# Patient Record
Sex: Female | Born: 1955 | Race: Black or African American | Hispanic: No | Marital: Married | Smoking: Never smoker
Health system: Southern US, Community
[De-identification: ages and names within clinical notes are randomized; demographics above are authoritative.]

## PROBLEM LIST (undated history)

## (undated) DIAGNOSIS — H16149 Punctate keratitis, unspecified eye: Secondary | ICD-10-CM

## (undated) DIAGNOSIS — S52501A Unspecified fracture of the lower end of right radius, initial encounter for closed fracture: Secondary | ICD-10-CM

## (undated) DIAGNOSIS — R079 Chest pain, unspecified: Secondary | ICD-10-CM

## (undated) DIAGNOSIS — E2839 Other primary ovarian failure: Secondary | ICD-10-CM

## (undated) HISTORY — DX: Unspecified fracture of the lower end of right radius, initial encounter for closed fracture: S52.501A

## (undated) HISTORY — DX: Punctate keratitis, unspecified eye: H16.149

## (undated) HISTORY — DX: Chest pain, unspecified: R07.9

## (undated) HISTORY — DX: Other primary ovarian failure: E28.39

---

## 1999-04-15 ENCOUNTER — Other Ambulatory Visit: Admission: RE | Admit: 1999-04-15 | Discharge: 1999-04-15 | Payer: Self-pay | Admitting: Family Medicine

## 1999-04-21 ENCOUNTER — Ambulatory Visit (HOSPITAL_COMMUNITY): Admission: RE | Admit: 1999-04-21 | Discharge: 1999-04-21 | Payer: Self-pay | Admitting: Family Medicine

## 1999-04-21 ENCOUNTER — Encounter: Payer: Self-pay | Admitting: Family Medicine

## 2000-03-22 ENCOUNTER — Encounter: Payer: Self-pay | Admitting: Family Medicine

## 2000-03-22 ENCOUNTER — Encounter: Admission: RE | Admit: 2000-03-22 | Discharge: 2000-03-22 | Payer: Self-pay | Admitting: Family Medicine

## 2000-03-25 ENCOUNTER — Encounter: Payer: Self-pay | Admitting: Family Medicine

## 2000-03-25 ENCOUNTER — Encounter: Admission: RE | Admit: 2000-03-25 | Discharge: 2000-03-25 | Payer: Self-pay | Admitting: Family Medicine

## 2000-04-09 ENCOUNTER — Encounter: Payer: Self-pay | Admitting: Family Medicine

## 2000-04-09 ENCOUNTER — Encounter: Admission: RE | Admit: 2000-04-09 | Discharge: 2000-04-09 | Payer: Self-pay | Admitting: Family Medicine

## 2000-04-22 ENCOUNTER — Encounter: Payer: Self-pay | Admitting: Family Medicine

## 2000-04-22 ENCOUNTER — Ambulatory Visit (HOSPITAL_COMMUNITY): Admission: RE | Admit: 2000-04-22 | Discharge: 2000-04-22 | Payer: Self-pay | Admitting: Family Medicine

## 2003-11-08 ENCOUNTER — Other Ambulatory Visit: Admission: RE | Admit: 2003-11-08 | Discharge: 2003-11-08 | Payer: Self-pay | Admitting: Family Medicine

## 2003-11-09 ENCOUNTER — Encounter: Admission: RE | Admit: 2003-11-09 | Discharge: 2003-11-09 | Payer: Self-pay | Admitting: Family Medicine

## 2003-11-16 ENCOUNTER — Ambulatory Visit (HOSPITAL_COMMUNITY): Admission: RE | Admit: 2003-11-16 | Discharge: 2003-11-16 | Payer: Self-pay | Admitting: Family Medicine

## 2005-01-08 ENCOUNTER — Ambulatory Visit (HOSPITAL_COMMUNITY): Admission: RE | Admit: 2005-01-08 | Discharge: 2005-01-08 | Payer: Self-pay | Admitting: Family Medicine

## 2006-01-11 ENCOUNTER — Ambulatory Visit (HOSPITAL_COMMUNITY): Admission: RE | Admit: 2006-01-11 | Discharge: 2006-01-11 | Payer: Self-pay | Admitting: Family Medicine

## 2006-05-21 ENCOUNTER — Ambulatory Visit: Payer: Self-pay | Admitting: Family Medicine

## 2006-06-03 ENCOUNTER — Ambulatory Visit: Payer: Self-pay | Admitting: Family Medicine

## 2006-06-03 LAB — CONVERTED CEMR LAB
BUN: 4 mg/dL — ABNORMAL LOW (ref 6–23)
Basophils Absolute: 0 10*3/uL (ref 0.0–0.1)
CO2: 28 meq/L (ref 19–32)
Chloride: 105 meq/L (ref 96–112)
Cholesterol: 182 mg/dL (ref 0–200)
Creatinine, Ser: 0.9 mg/dL (ref 0.4–1.2)
Eosinophils Absolute: 0 10*3/uL (ref 0.0–0.6)
HDL: 66.1 mg/dL (ref >39.0)
LDL Cholesterol: 98 mg/dL (ref 0–99)
Lymphocytes Relative: 29.3 % (ref 12.0–46.0)
MCV: 90.8 fL (ref 78.0–100.0)
Neutrophils Relative %: 63.2 % (ref 43.0–77.0)
Platelets: 243 10*3/uL (ref 150–400)
RBC: 4.15 M/uL (ref 3.87–5.11)
Sodium: 140 meq/L (ref 135–145)
TSH: 0.9 microintl units/mL (ref 0.35–5.50)
Total CHOL/HDL Ratio: 2.8

## 2006-06-10 ENCOUNTER — Ambulatory Visit: Payer: Self-pay | Admitting: Family Medicine

## 2006-06-18 ENCOUNTER — Ambulatory Visit: Payer: Self-pay | Admitting: Family Medicine

## 2006-06-21 ENCOUNTER — Encounter: Admission: RE | Admit: 2006-06-21 | Discharge: 2006-06-21 | Payer: Self-pay | Admitting: Family Medicine

## 2006-06-25 ENCOUNTER — Other Ambulatory Visit: Admission: RE | Admit: 2006-06-25 | Discharge: 2006-06-25 | Payer: Self-pay | Admitting: Family Medicine

## 2006-06-25 ENCOUNTER — Encounter: Payer: Self-pay | Admitting: Family Medicine

## 2006-06-25 ENCOUNTER — Ambulatory Visit: Payer: Self-pay | Admitting: Family Medicine

## 2006-10-25 ENCOUNTER — Ambulatory Visit: Payer: Self-pay | Admitting: Family Medicine

## 2007-01-03 ENCOUNTER — Telehealth: Payer: Self-pay | Admitting: Family Medicine

## 2007-01-14 ENCOUNTER — Ambulatory Visit (HOSPITAL_COMMUNITY): Admission: RE | Admit: 2007-01-14 | Discharge: 2007-01-14 | Payer: Self-pay | Admitting: Family Medicine

## 2007-01-18 ENCOUNTER — Telehealth: Payer: Self-pay | Admitting: Family Medicine

## 2007-01-19 ENCOUNTER — Encounter: Admission: RE | Admit: 2007-01-19 | Discharge: 2007-01-19 | Payer: Self-pay | Admitting: Family Medicine

## 2007-06-16 ENCOUNTER — Ambulatory Visit: Payer: Self-pay | Admitting: Family Medicine

## 2007-06-16 DIAGNOSIS — G43909 Migraine, unspecified, not intractable, without status migrainosus: Secondary | ICD-10-CM | POA: Insufficient documentation

## 2007-06-16 DIAGNOSIS — L659 Nonscarring hair loss, unspecified: Secondary | ICD-10-CM | POA: Insufficient documentation

## 2007-07-26 ENCOUNTER — Ambulatory Visit: Payer: Self-pay | Admitting: Family Medicine

## 2007-07-26 LAB — CONVERTED CEMR LAB
Alkaline Phosphatase: 60 units/L (ref 39–117)
Bilirubin Urine: NEGATIVE
Bilirubin, Direct: 0.1 mg/dL (ref 0.0–0.3)
Blood in Urine, dipstick: NEGATIVE
Calcium: 9.3 mg/dL (ref 8.4–10.5)
Chloride: 108 meq/L (ref 96–112)
Creatinine, Ser: 0.8 mg/dL (ref 0.4–1.2)
Glucose, Bld: 88 mg/dL (ref 70–99)
Glucose, Urine, Semiquant: NEGATIVE
HCT: 39.7 % (ref 36.0–46.0)
Ketones, urine, test strip: NEGATIVE
LDL Cholesterol: 100 mg/dL — ABNORMAL HIGH (ref 0–99)
MCV: 88.9 fL (ref 78.0–100.0)
Monocytes Absolute: 0.3 10*3/uL (ref 0.1–1.0)
Nitrite: NEGATIVE
Platelets: 196 10*3/uL (ref 150–400)
Potassium: 3.8 meq/L (ref 3.5–5.1)
RBC: 4.47 M/uL (ref 3.87–5.11)
Total Bilirubin: 0.8 mg/dL (ref 0.3–1.2)
Total CHOL/HDL Ratio: 2.4
Triglycerides: 52 mg/dL (ref 0–149)
Urobilinogen, UA: 0.2
VLDL: 10 mg/dL (ref 0–40)
WBC Urine, dipstick: NEGATIVE

## 2007-08-02 ENCOUNTER — Other Ambulatory Visit: Admission: RE | Admit: 2007-08-02 | Discharge: 2007-08-02 | Payer: Self-pay | Admitting: Family Medicine

## 2007-08-02 ENCOUNTER — Encounter: Payer: Self-pay | Admitting: Family Medicine

## 2007-08-02 ENCOUNTER — Ambulatory Visit: Payer: Self-pay | Admitting: Family Medicine

## 2007-08-02 ENCOUNTER — Encounter: Payer: Self-pay | Admitting: Internal Medicine

## 2007-08-02 DIAGNOSIS — N952 Postmenopausal atrophic vaginitis: Secondary | ICD-10-CM

## 2008-01-24 ENCOUNTER — Ambulatory Visit (HOSPITAL_COMMUNITY): Admission: RE | Admit: 2008-01-24 | Discharge: 2008-01-24 | Payer: Self-pay | Admitting: Family Medicine

## 2009-01-24 ENCOUNTER — Ambulatory Visit (HOSPITAL_COMMUNITY): Admission: RE | Admit: 2009-01-24 | Discharge: 2009-01-24 | Payer: Self-pay | Admitting: Family Medicine

## 2010-01-28 ENCOUNTER — Ambulatory Visit (HOSPITAL_COMMUNITY): Admission: RE | Admit: 2010-01-28 | Discharge: 2010-01-28 | Payer: Self-pay | Admitting: Family Medicine

## 2011-01-12 ENCOUNTER — Other Ambulatory Visit (HOSPITAL_COMMUNITY): Payer: Self-pay | Admitting: Family Medicine

## 2011-01-12 DIAGNOSIS — Z1231 Encounter for screening mammogram for malignant neoplasm of breast: Secondary | ICD-10-CM

## 2011-01-30 ENCOUNTER — Ambulatory Visit (HOSPITAL_COMMUNITY)
Admission: RE | Admit: 2011-01-30 | Discharge: 2011-01-30 | Disposition: A | Discharge status: Home / Self Care | Payer: BC Managed Care – PPO | Source: Ambulatory Visit | Attending: Family Medicine | Admitting: Family Medicine

## 2011-01-30 DIAGNOSIS — Z1231 Encounter for screening mammogram for malignant neoplasm of breast: Secondary | ICD-10-CM | POA: Insufficient documentation

## 2011-12-31 ENCOUNTER — Other Ambulatory Visit (HOSPITAL_COMMUNITY): Payer: Self-pay | Admitting: Family Medicine

## 2011-12-31 DIAGNOSIS — Z1231 Encounter for screening mammogram for malignant neoplasm of breast: Secondary | ICD-10-CM

## 2012-01-19 ENCOUNTER — Other Ambulatory Visit (HOSPITAL_COMMUNITY)
Admission: RE | Admit: 2012-01-19 | Discharge: 2012-01-19 | Disposition: A | Discharge status: Home / Self Care | Payer: BC Managed Care – PPO | Source: Ambulatory Visit | Attending: Family Medicine | Admitting: Family Medicine

## 2012-01-19 ENCOUNTER — Other Ambulatory Visit: Payer: Self-pay | Admitting: Family Medicine

## 2012-01-19 DIAGNOSIS — Z01419 Encounter for gynecological examination (general) (routine) without abnormal findings: Secondary | ICD-10-CM | POA: Insufficient documentation

## 2012-02-04 ENCOUNTER — Ambulatory Visit (HOSPITAL_COMMUNITY)
Admission: RE | Admit: 2012-02-04 | Discharge: 2012-02-04 | Disposition: A | Discharge status: Home / Self Care | Payer: BC Managed Care – PPO | Source: Ambulatory Visit | Attending: Family Medicine | Admitting: Family Medicine

## 2012-02-04 DIAGNOSIS — Z1231 Encounter for screening mammogram for malignant neoplasm of breast: Secondary | ICD-10-CM

## 2012-12-30 ENCOUNTER — Other Ambulatory Visit (HOSPITAL_COMMUNITY): Payer: Self-pay | Admitting: Family Medicine

## 2012-12-30 DIAGNOSIS — Z1231 Encounter for screening mammogram for malignant neoplasm of breast: Secondary | ICD-10-CM

## 2013-02-06 ENCOUNTER — Ambulatory Visit (HOSPITAL_COMMUNITY)
Admission: RE | Admit: 2013-02-06 | Discharge: 2013-02-06 | Disposition: A | Discharge status: Home / Self Care | Payer: BC Managed Care – PPO | Source: Ambulatory Visit | Attending: Family Medicine | Admitting: Family Medicine

## 2013-02-06 DIAGNOSIS — Z1231 Encounter for screening mammogram for malignant neoplasm of breast: Secondary | ICD-10-CM | POA: Insufficient documentation

## 2014-01-12 ENCOUNTER — Other Ambulatory Visit (HOSPITAL_COMMUNITY): Payer: Self-pay | Admitting: Family Medicine

## 2014-01-12 DIAGNOSIS — Z1231 Encounter for screening mammogram for malignant neoplasm of breast: Secondary | ICD-10-CM

## 2014-02-08 ENCOUNTER — Ambulatory Visit (HOSPITAL_COMMUNITY)
Admission: RE | Admit: 2014-02-08 | Discharge: 2014-02-08 | Disposition: A | Discharge status: Home / Self Care | Payer: BC Managed Care – PPO | Source: Ambulatory Visit | Attending: Family Medicine | Admitting: Family Medicine

## 2014-02-08 DIAGNOSIS — Z1231 Encounter for screening mammogram for malignant neoplasm of breast: Secondary | ICD-10-CM | POA: Diagnosis not present

## 2014-12-11 ENCOUNTER — Encounter (INDEPENDENT_AMBULATORY_CARE_PROVIDER_SITE_OTHER): Payer: BC Managed Care – PPO | Admitting: Ophthalmology

## 2014-12-11 DIAGNOSIS — H43813 Vitreous degeneration, bilateral: Secondary | ICD-10-CM

## 2015-02-14 ENCOUNTER — Other Ambulatory Visit: Payer: Self-pay

## 2015-02-14 DIAGNOSIS — Z1231 Encounter for screening mammogram for malignant neoplasm of breast: Secondary | ICD-10-CM

## 2015-03-01 ENCOUNTER — Ambulatory Visit
Admission: RE | Admit: 2015-03-01 | Discharge: 2015-03-01 | Disposition: A | Discharge status: Home / Self Care | Payer: BC Managed Care – PPO | Source: Ambulatory Visit

## 2015-03-01 DIAGNOSIS — Z1231 Encounter for screening mammogram for malignant neoplasm of breast: Secondary | ICD-10-CM

## 2015-05-24 ENCOUNTER — Other Ambulatory Visit (HOSPITAL_COMMUNITY)
Admission: RE | Admit: 2015-05-24 | Discharge: 2015-05-24 | Disposition: A | Discharge status: Home / Self Care | Payer: BC Managed Care – PPO | Source: Ambulatory Visit | Attending: Family Medicine | Admitting: Family Medicine

## 2015-05-24 ENCOUNTER — Other Ambulatory Visit: Payer: Self-pay | Admitting: Family Medicine

## 2015-05-24 DIAGNOSIS — Z1151 Encounter for screening for human papillomavirus (HPV): Secondary | ICD-10-CM | POA: Insufficient documentation

## 2015-05-24 DIAGNOSIS — Z01419 Encounter for gynecological examination (general) (routine) without abnormal findings: Secondary | ICD-10-CM | POA: Insufficient documentation

## 2015-05-28 LAB — CYTOLOGY - PAP

## 2016-01-27 ENCOUNTER — Other Ambulatory Visit: Payer: Self-pay | Admitting: Family Medicine

## 2016-01-27 DIAGNOSIS — Z1231 Encounter for screening mammogram for malignant neoplasm of breast: Secondary | ICD-10-CM

## 2016-03-02 ENCOUNTER — Ambulatory Visit
Admission: RE | Admit: 2016-03-02 | Discharge: 2016-03-02 | Disposition: A | Discharge status: Home / Self Care | Payer: BC Managed Care – PPO | Source: Ambulatory Visit | Attending: Family Medicine | Admitting: Family Medicine

## 2016-03-02 DIAGNOSIS — Z1231 Encounter for screening mammogram for malignant neoplasm of breast: Secondary | ICD-10-CM

## 2017-01-22 ENCOUNTER — Other Ambulatory Visit: Payer: Self-pay | Admitting: Family Medicine

## 2017-01-22 DIAGNOSIS — Z1231 Encounter for screening mammogram for malignant neoplasm of breast: Secondary | ICD-10-CM

## 2017-03-03 ENCOUNTER — Ambulatory Visit
Admission: RE | Admit: 2017-03-03 | Discharge: 2017-03-03 | Disposition: A | Discharge status: Home / Self Care | Payer: BC Managed Care – PPO | Source: Ambulatory Visit | Attending: Family Medicine | Admitting: Family Medicine

## 2017-03-03 DIAGNOSIS — Z1231 Encounter for screening mammogram for malignant neoplasm of breast: Secondary | ICD-10-CM

## 2018-04-08 ENCOUNTER — Other Ambulatory Visit: Payer: Self-pay | Admitting: Family Medicine

## 2018-04-08 DIAGNOSIS — Z1231 Encounter for screening mammogram for malignant neoplasm of breast: Secondary | ICD-10-CM

## 2018-05-10 ENCOUNTER — Other Ambulatory Visit: Payer: Self-pay | Admitting: Family Medicine

## 2018-05-10 ENCOUNTER — Ambulatory Visit
Admission: RE | Admit: 2018-05-10 | Discharge: 2018-05-10 | Disposition: A | Discharge status: Home / Self Care | Payer: BC Managed Care – PPO | Source: Ambulatory Visit | Attending: Family Medicine | Admitting: Family Medicine

## 2018-05-10 DIAGNOSIS — Z1231 Encounter for screening mammogram for malignant neoplasm of breast: Secondary | ICD-10-CM

## 2018-09-13 ENCOUNTER — Other Ambulatory Visit: Payer: Self-pay

## 2018-09-13 ENCOUNTER — Ambulatory Visit
Admission: EM | Admit: 2018-09-13 | Discharge: 2018-09-13 | Disposition: A | Discharge status: Home / Self Care | Payer: BC Managed Care – PPO | Attending: Physician Assistant | Admitting: Physician Assistant

## 2018-09-13 DIAGNOSIS — M654 Radial styloid tenosynovitis [de Quervain]: Secondary | ICD-10-CM | POA: Diagnosis not present

## 2018-09-13 MED ORDER — MELOXICAM 7.5 MG PO TABS: 7.5000 mg | ORAL_TABLET | Freq: Every day | ORAL | 0 refills | Status: AC

## 2018-09-13 NOTE — ED Provider Notes (Signed)
[facility]    CSN: [id number] Arrival date & time: 09/13/18  1517     History   Chief Complaint Chief Complaint  Patient presents with  . Wrist Pain    HPI Dominique Vazquez is a 63 y.o. female.   63 year old female comes in for 3-week history of left wrist pain.  Denies injury/trauma.  States pain mostly to the radial aspect of the wrist, worse with movement.  She denies increase in activity.  Does do weightlifting frequently.  She has a wrist splint, and has been wearing it with good relief.  States pain is worse with flexion and supination.  Denies numbness/tingling.  Denies radiation of pain.  Has not taken anything for symptoms.     History reviewed. No pertinent past medical history.  Patient Active Problem List   Diagnosis Date Noted  . POSTMENOPAUSAL ATROPHIC VAGINITIS 08/02/2007  . MIGRAINE HEADACHE 06/16/2007  . HAIR LOSS 06/16/2007    History reviewed. No pertinent surgical history.  OB History   No obstetric history on file.      Home Medications    Prior to Admission medications   Medication Sig Start Date End Date Taking? Authorizing Provider  meloxicam (MOBIC) 7.5 MG tablet Take 1 tablet (7.5 mg total) by mouth daily. 09/13/18   Ok Edwards, PA-C    Family History No family history on file.  Social History Social History   Tobacco Use  . Smoking status: Never Smoker  . Smokeless tobacco: Never Used  Substance Use Topics  . Alcohol use: Not Currently  . Drug use: Not on file     Allergies   Penicillins   Review of Systems Review of Systems  Reason unable to perform ROS: See HPI as above.     Physical Exam Triage Vital Signs ED Triage Vitals [09/13/18 1526]  Enc Vitals Group     BP (!) 165/85     Pulse Rate 65     Resp 20     Temp 98.7 F (37.1 C)     Temp Source Oral     SpO2 98 %     Weight      Height      Head Circumference      Peak Flow      Pain Score 5     Pain Loc      Pain Edu?      Excl. in [place]?     No data found.  Updated Vital Signs BP (!) 165/85 (BP Location: Left Arm)   Pulse 65   Temp 98.7 F (37.1 C) (Oral)   Resp 20   SpO2 98%   Physical Exam Constitutional:      General: She is not in acute distress.    Appearance: She is well-developed. She is not diaphoretic.  HENT:     Head: Normocephalic and atraumatic.  Eyes:     Conjunctiva/sclera: Conjunctivae normal.     Pupils: Pupils are equal, round, and reactive to light.  Musculoskeletal:     Comments: No swelling, erythema, warmth, contusion seen.  Tenderness to palpation of radial wrist.  Full range of motion of wrist, fingers.  Strength normal and equal bilaterally.  Sensation intact and equal bilaterally.  Radial pulse 2+, cap refill less than 2 seconds.  Negative Phalen's, Tinel's, Finkelstein.  Neurological:     Mental Status: She is alert and oriented to person, place, and time.     UC Treatments / Results  Labs (all labs  ordered are listed, but only abnormal results are displayed) Labs Reviewed - No data to display  EKG None  Radiology No results found.  Procedures Procedures (including critical care time)  Medications Ordered in UC Medications - No data to display  Initial Impression / Assessment and Plan / UC Course  I have reviewed the triage vital signs and the nursing notes.  Pertinent labs & imaging results that were available during my care of the patient were reviewed by me and considered in my medical decision making (see chart for details).    Although negative for Finkelstein's, patient history and exam was concerning for de Quervain's given radial tenderness to wrist with movement.  Will start NSAIDs, ice compress, continue wrist splint during activity.  Discussed resting and refrain from weight training at this time.  Return precautions given.  Patient expresses understanding and agrees to plan.  Final Clinical Impressions(s) / UC Diagnoses   Final diagnoses:  Dominique Vazquez  disease (radial styloid tenosynovitis)   ED Prescriptions    Medication Sig Dispense Auth. Provider   meloxicam (MOBIC) 7.5 MG tablet Take 1 tablet (7.5 mg total) by mouth daily. 15 tablet Tobin Chad, [place] 09/13/18 1702

## 2018-09-13 NOTE — ED Triage Notes (Signed)
Pt c/o lt wrist pain for the past 3wks, denies injury

## 2018-09-13 NOTE — Discharge Instructions (Signed)
Start Mobic. Do not take ibuprofen (motrin/advil)/ naproxen (aleve) while on mobic. You can add on voltaren gel as needed. Ice compress. Rest strenuous activity until symptoms improve.

## 2019-03-31 ENCOUNTER — Other Ambulatory Visit: Payer: Self-pay | Admitting: Family Medicine

## 2019-03-31 DIAGNOSIS — Z1231 Encounter for screening mammogram for malignant neoplasm of breast: Secondary | ICD-10-CM

## 2019-05-17 ENCOUNTER — Ambulatory Visit
Admission: RE | Admit: 2019-05-17 | Discharge: 2019-05-17 | Disposition: A | Discharge status: Home / Self Care | Payer: BC Managed Care – PPO | Source: Ambulatory Visit | Attending: Family Medicine | Admitting: Family Medicine

## 2019-05-17 ENCOUNTER — Other Ambulatory Visit: Payer: Self-pay

## 2019-05-17 DIAGNOSIS — Z1231 Encounter for screening mammogram for malignant neoplasm of breast: Secondary | ICD-10-CM

## 2019-06-15 ENCOUNTER — Ambulatory Visit: Payer: BC Managed Care – PPO | Attending: Internal Medicine

## 2019-06-15 DIAGNOSIS — Z20822 Contact with and (suspected) exposure to covid-19: Secondary | ICD-10-CM

## 2019-06-16 ENCOUNTER — Ambulatory Visit: Payer: Self-pay | Admitting: *Deleted

## 2019-06-16 LAB — NOVEL CORONAVIRUS, NAA: SARS-CoV-2, NAA: DETECTED — AB

## 2019-06-16 NOTE — Telephone Encounter (Signed)
Per initial encounter, " Patient is calling to ask a couple of questions about her positive COVID test. 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  Reason for Disposition . General information question, no triage required and triager able to answer question  Answer Assessment - Initial Assessment Questions 1. 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

## 2019-06-28 ENCOUNTER — Ambulatory Visit: Payer: BC Managed Care – PPO | Attending: Internal Medicine

## 2019-06-28 DIAGNOSIS — Z20822 Contact with and (suspected) exposure to covid-19: Secondary | ICD-10-CM

## 2019-06-29 LAB — NOVEL CORONAVIRUS, NAA: SARS-CoV-2, NAA: NOT DETECTED

## 2020-04-02 ENCOUNTER — Other Ambulatory Visit: Payer: Self-pay | Admitting: Family Medicine

## 2020-04-02 DIAGNOSIS — Z1231 Encounter for screening mammogram for malignant neoplasm of breast: Secondary | ICD-10-CM

## 2020-05-17 ENCOUNTER — Ambulatory Visit
Admission: RE | Admit: 2020-05-17 | Discharge: 2020-05-17 | Disposition: A | Discharge status: Home / Self Care | Payer: BC Managed Care – PPO | Source: Ambulatory Visit | Attending: Family Medicine | Admitting: Family Medicine

## 2020-05-17 ENCOUNTER — Other Ambulatory Visit: Payer: Self-pay

## 2020-05-17 DIAGNOSIS — Z1231 Encounter for screening mammogram for malignant neoplasm of breast: Secondary | ICD-10-CM | POA: Diagnosis not present

## 2020-10-25 DIAGNOSIS — Z03818 Encounter for observation for suspected exposure to other biological agents ruled out: Secondary | ICD-10-CM | POA: Diagnosis not present

## 2020-10-25 DIAGNOSIS — Z20822 Contact with and (suspected) exposure to covid-19: Secondary | ICD-10-CM | POA: Diagnosis not present

## 2021-01-15 DIAGNOSIS — Z0000 Encounter for general adult medical examination without abnormal findings: Secondary | ICD-10-CM | POA: Diagnosis not present

## 2021-01-15 DIAGNOSIS — Z131 Encounter for screening for diabetes mellitus: Secondary | ICD-10-CM | POA: Diagnosis not present

## 2021-01-30 DIAGNOSIS — S60562A Insect bite (nonvenomous) of left hand, initial encounter: Secondary | ICD-10-CM | POA: Diagnosis not present

## 2021-01-30 DIAGNOSIS — R21 Rash and other nonspecific skin eruption: Secondary | ICD-10-CM | POA: Diagnosis not present

## 2021-01-30 DIAGNOSIS — L089 Local infection of the skin and subcutaneous tissue, unspecified: Secondary | ICD-10-CM | POA: Diagnosis not present

## 2021-02-21 DIAGNOSIS — H2511 Age-related nuclear cataract, right eye: Secondary | ICD-10-CM | POA: Diagnosis not present

## 2021-02-21 DIAGNOSIS — H2513 Age-related nuclear cataract, bilateral: Secondary | ICD-10-CM | POA: Diagnosis not present

## 2021-03-13 IMAGING — MG MM DIGITAL SCREENING BILAT W/ TOMO AND CAD
8 series · 8 of 24 positions shown · non-contrast
Comparison: Previous exam(s).

CLINICAL DATA: Screening.

EXAM:
DIGITAL SCREENING BILATERAL MAMMOGRAM WITH TOMOSYNTHESIS AND CAD
TECHNIQUE: Bilateral screening digital craniocaudal and mediolateral oblique
mammograms were obtained. Bilateral screening digital breast
tomosynthesis was performed. The images were evaluated with
computer-aided detection.

[L CC synth-2D]
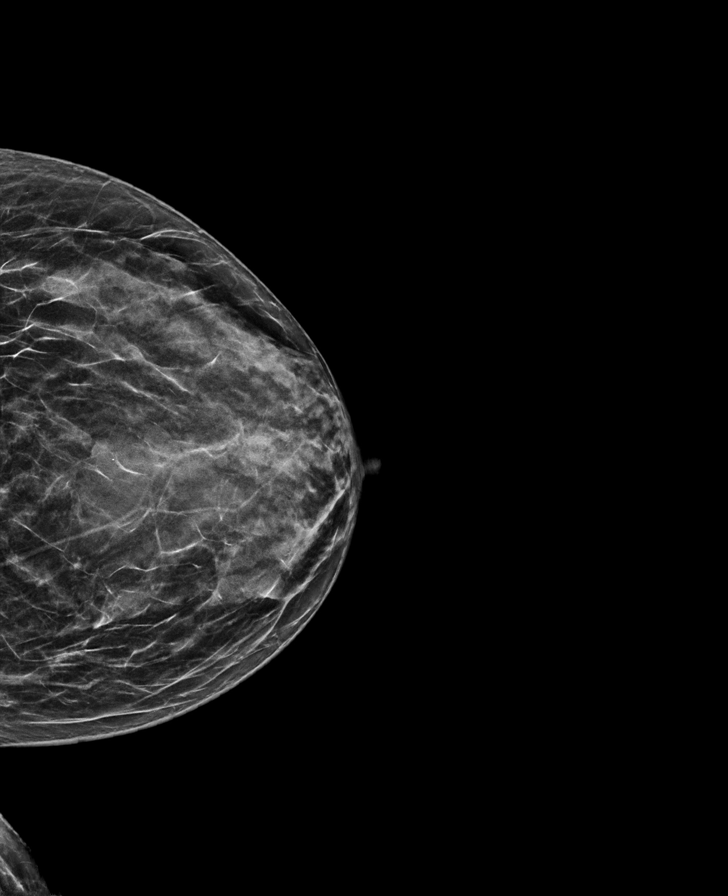

[R MLO synth-2D]
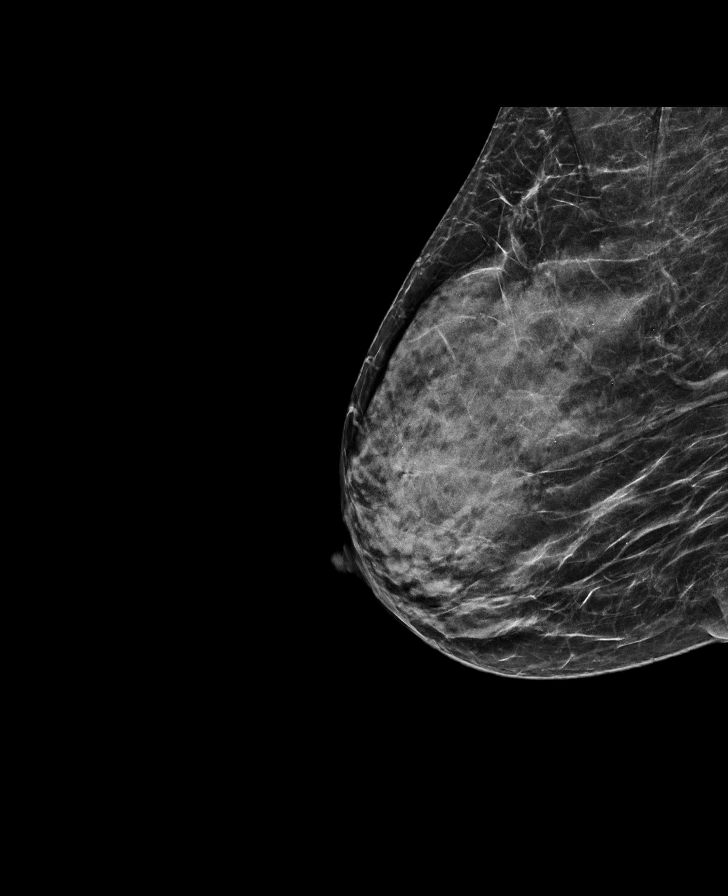

[L MLO synth-2D]
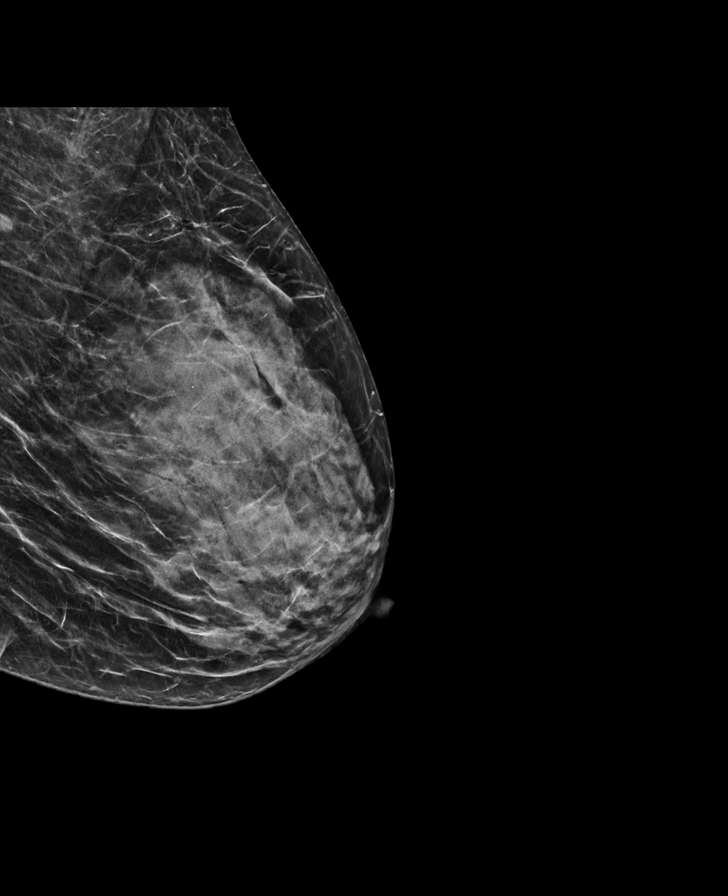

[R CC synth-2D]
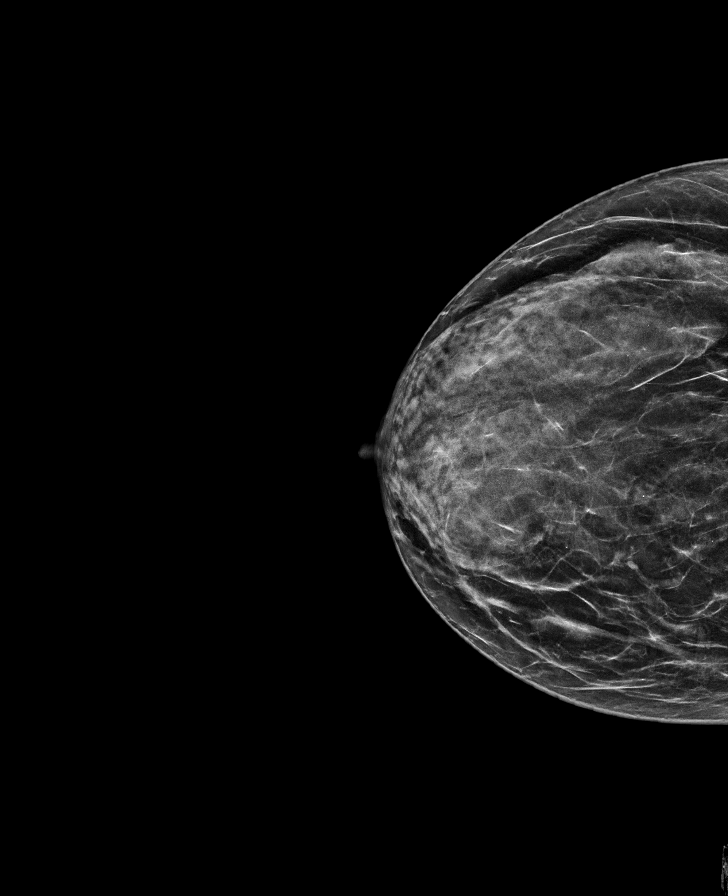

[L CC tomo · tomo slice 27/53.0]
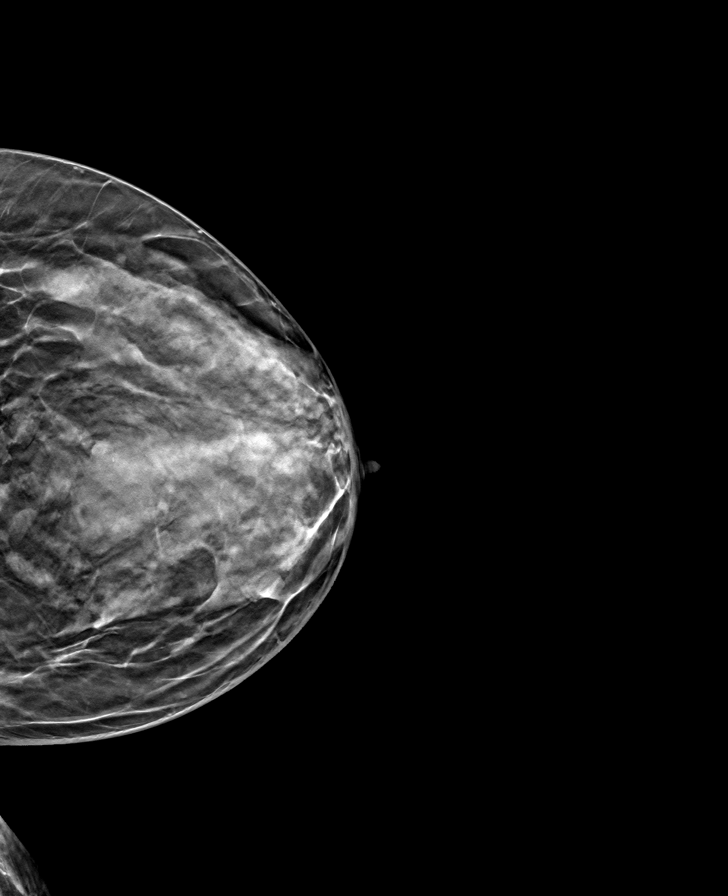

[R MLO tomo · tomo slice 27/54.0]
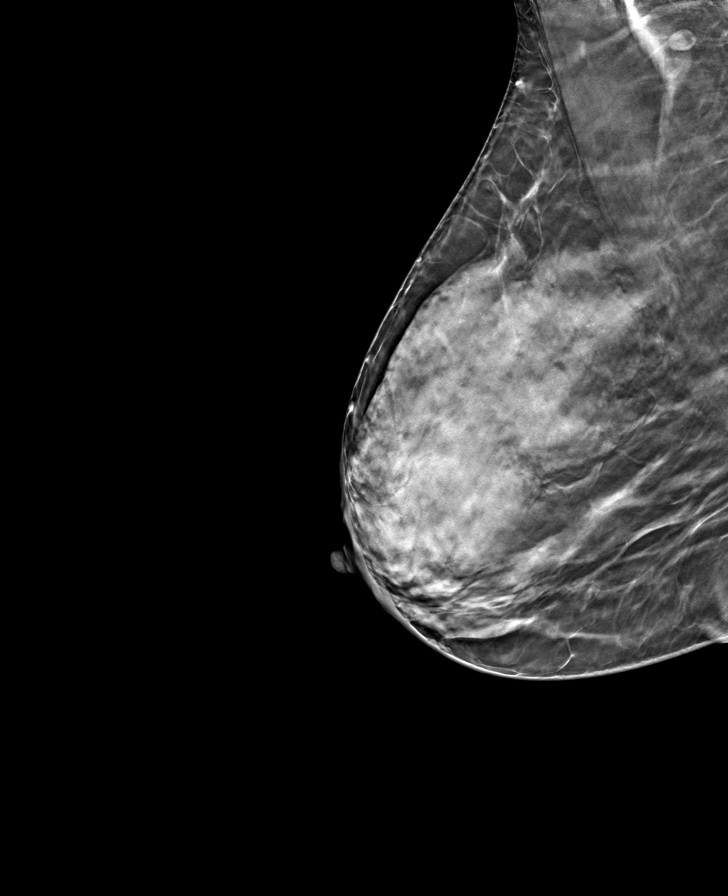

[L MLO tomo · tomo slice 27/53.0]
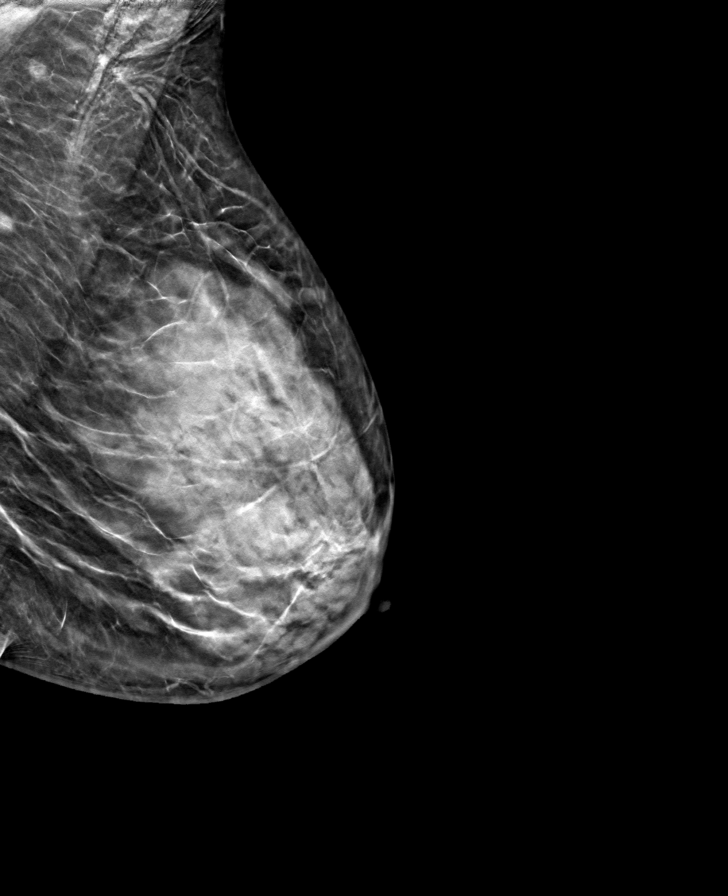

[R CC tomo · tomo slice 27/53.0]
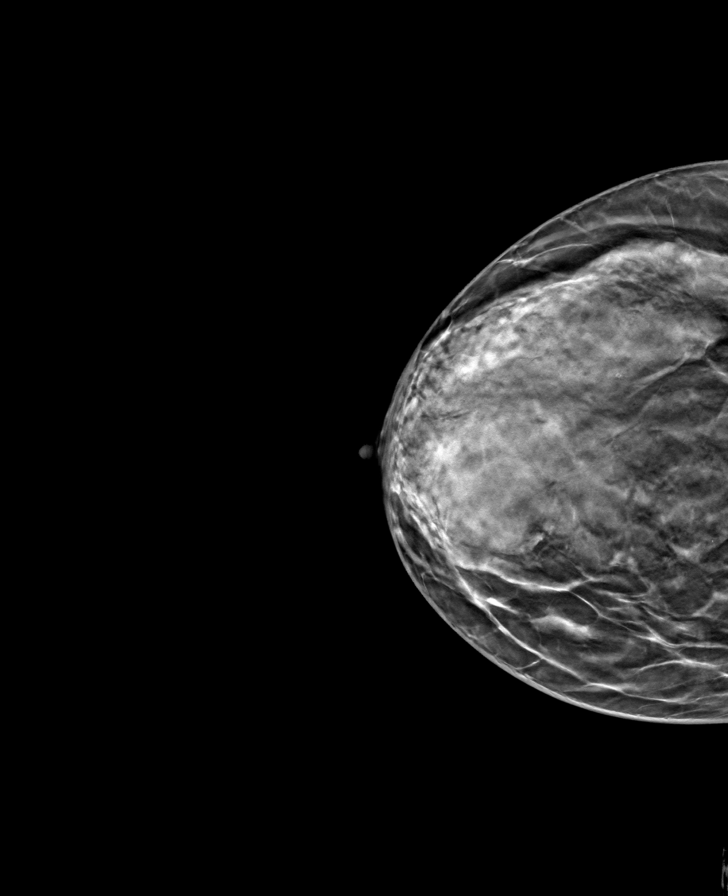

[8 of 24 positions shown; findings below may reference images not displayed]

ACR Breast Density Category d: The breast tissue is extremely dense,
which lowers the sensitivity of mammography
FINDINGS: There are no findings suspicious for malignancy.
IMPRESSION: No mammographic evidence of malignancy. A result letter of this
screening mammogram will be mailed directly to the patient.

RECOMMENDATION:
Screening mammogram in one year. (Code:[id number])

BI-RADS CATEGORY  1: Negative.

## 2021-04-01 ENCOUNTER — Ambulatory Visit
Admission: RE | Admit: 2021-04-01 | Discharge: 2021-04-01 | Disposition: A | Discharge status: Home / Self Care | Payer: Medicare Other | Source: Ambulatory Visit | Attending: Family Medicine | Admitting: Family Medicine

## 2021-04-01 ENCOUNTER — Other Ambulatory Visit: Payer: Self-pay

## 2021-04-01 ENCOUNTER — Other Ambulatory Visit: Payer: Self-pay | Admitting: Family Medicine

## 2021-04-01 DIAGNOSIS — S63501A Unspecified sprain of right wrist, initial encounter: Secondary | ICD-10-CM

## 2021-04-01 DIAGNOSIS — W19XXXA Unspecified fall, initial encounter: Secondary | ICD-10-CM | POA: Diagnosis not present

## 2021-04-01 DIAGNOSIS — S62101A Fracture of unspecified carpal bone, right wrist, initial encounter for closed fracture: Secondary | ICD-10-CM | POA: Diagnosis not present

## 2021-04-01 DIAGNOSIS — M25531 Pain in right wrist: Secondary | ICD-10-CM | POA: Diagnosis not present

## 2021-04-03 DIAGNOSIS — H2513 Age-related nuclear cataract, bilateral: Secondary | ICD-10-CM | POA: Diagnosis not present

## 2021-04-03 DIAGNOSIS — H2511 Age-related nuclear cataract, right eye: Secondary | ICD-10-CM | POA: Diagnosis not present

## 2021-04-03 DIAGNOSIS — Z961 Presence of intraocular lens: Secondary | ICD-10-CM | POA: Diagnosis not present

## 2021-04-04 DIAGNOSIS — H2512 Age-related nuclear cataract, left eye: Secondary | ICD-10-CM | POA: Diagnosis not present

## 2021-04-10 DIAGNOSIS — S52531D Colles' fracture of right radius, subsequent encounter for closed fracture with routine healing: Secondary | ICD-10-CM | POA: Diagnosis not present

## 2021-04-25 DIAGNOSIS — S52531D Colles' fracture of right radius, subsequent encounter for closed fracture with routine healing: Secondary | ICD-10-CM | POA: Diagnosis not present

## 2021-05-08 DIAGNOSIS — K635 Polyp of colon: Secondary | ICD-10-CM | POA: Diagnosis not present

## 2021-05-08 DIAGNOSIS — Z1211 Encounter for screening for malignant neoplasm of colon: Secondary | ICD-10-CM | POA: Diagnosis not present

## 2021-05-08 DIAGNOSIS — Z09 Encounter for follow-up examination after completed treatment for conditions other than malignant neoplasm: Secondary | ICD-10-CM | POA: Diagnosis not present

## 2021-05-08 DIAGNOSIS — Z86010 Personal history of colonic polyps: Secondary | ICD-10-CM | POA: Diagnosis not present

## 2021-05-08 DIAGNOSIS — D122 Benign neoplasm of ascending colon: Secondary | ICD-10-CM | POA: Diagnosis not present

## 2021-05-16 DIAGNOSIS — S52531D Colles' fracture of right radius, subsequent encounter for closed fracture with routine healing: Secondary | ICD-10-CM | POA: Diagnosis not present

## 2021-05-17 DIAGNOSIS — H16143 Punctate keratitis, bilateral: Secondary | ICD-10-CM | POA: Diagnosis not present

## 2021-05-19 ENCOUNTER — Other Ambulatory Visit: Payer: Self-pay | Admitting: Family Medicine

## 2021-05-19 DIAGNOSIS — E2839 Other primary ovarian failure: Secondary | ICD-10-CM

## 2021-05-19 DIAGNOSIS — H10023 Other mucopurulent conjunctivitis, bilateral: Secondary | ICD-10-CM | POA: Diagnosis not present

## 2021-06-09 DIAGNOSIS — H04123 Dry eye syndrome of bilateral lacrimal glands: Secondary | ICD-10-CM | POA: Diagnosis not present

## 2021-07-17 ENCOUNTER — Ambulatory Visit
Admission: RE | Admit: 2021-07-17 | Discharge: 2021-07-17 | Disposition: A | Discharge status: Home / Self Care | Payer: BC Managed Care – PPO | Source: Ambulatory Visit | Attending: Family Medicine | Admitting: Family Medicine

## 2021-07-17 ENCOUNTER — Other Ambulatory Visit: Payer: Self-pay | Admitting: Family Medicine

## 2021-07-17 DIAGNOSIS — E2839 Other primary ovarian failure: Secondary | ICD-10-CM

## 2021-07-17 DIAGNOSIS — M810 Age-related osteoporosis without current pathological fracture: Secondary | ICD-10-CM | POA: Diagnosis not present

## 2021-07-17 DIAGNOSIS — Z780 Asymptomatic menopausal state: Secondary | ICD-10-CM | POA: Diagnosis not present

## 2021-07-17 DIAGNOSIS — Z1231 Encounter for screening mammogram for malignant neoplasm of breast: Secondary | ICD-10-CM

## 2021-07-17 DIAGNOSIS — M8589 Other specified disorders of bone density and structure, multiple sites: Secondary | ICD-10-CM | POA: Diagnosis not present

## 2021-07-22 ENCOUNTER — Other Ambulatory Visit: Payer: BC Managed Care – PPO

## 2021-08-05 ENCOUNTER — Other Ambulatory Visit: Payer: BC Managed Care – PPO

## 2021-09-09 DIAGNOSIS — H00021 Hordeolum internum right upper eyelid: Secondary | ICD-10-CM | POA: Diagnosis not present

## 2022-01-29 DIAGNOSIS — M79641 Pain in right hand: Secondary | ICD-10-CM | POA: Diagnosis not present

## 2022-01-29 DIAGNOSIS — M810 Age-related osteoporosis without current pathological fracture: Secondary | ICD-10-CM | POA: Diagnosis not present

## 2022-01-29 DIAGNOSIS — Z131 Encounter for screening for diabetes mellitus: Secondary | ICD-10-CM | POA: Diagnosis not present

## 2022-01-29 DIAGNOSIS — Z791 Long term (current) use of non-steroidal anti-inflammatories (NSAID): Secondary | ICD-10-CM | POA: Diagnosis not present

## 2022-01-29 DIAGNOSIS — H04121 Dry eye syndrome of right lacrimal gland: Secondary | ICD-10-CM | POA: Diagnosis not present

## 2022-01-29 DIAGNOSIS — Z0000 Encounter for general adult medical examination without abnormal findings: Secondary | ICD-10-CM | POA: Diagnosis not present

## 2022-01-29 DIAGNOSIS — Z86010 Personal history of colonic polyps: Secondary | ICD-10-CM | POA: Diagnosis not present

## 2022-05-15 DIAGNOSIS — H00021 Hordeolum internum right upper eyelid: Secondary | ICD-10-CM | POA: Diagnosis not present

## 2022-06-03 ENCOUNTER — Other Ambulatory Visit: Payer: Self-pay | Admitting: Family Medicine

## 2022-06-03 DIAGNOSIS — Z1231 Encounter for screening mammogram for malignant neoplasm of breast: Secondary | ICD-10-CM

## 2022-07-23 ENCOUNTER — Ambulatory Visit
Admission: RE | Admit: 2022-07-23 | Discharge: 2022-07-23 | Disposition: A | Discharge status: Home / Self Care | Payer: BC Managed Care – PPO | Source: Ambulatory Visit | Attending: Family Medicine | Admitting: Family Medicine

## 2022-07-23 DIAGNOSIS — Z1231 Encounter for screening mammogram for malignant neoplasm of breast: Secondary | ICD-10-CM

## 2022-09-07 DIAGNOSIS — R202 Paresthesia of skin: Secondary | ICD-10-CM | POA: Diagnosis not present

## 2022-09-07 DIAGNOSIS — R42 Dizziness and giddiness: Secondary | ICD-10-CM | POA: Diagnosis not present

## 2022-09-07 DIAGNOSIS — R55 Syncope and collapse: Secondary | ICD-10-CM | POA: Diagnosis not present

## 2022-09-07 DIAGNOSIS — I10 Essential (primary) hypertension: Secondary | ICD-10-CM | POA: Diagnosis not present

## 2022-09-09 DIAGNOSIS — R079 Chest pain, unspecified: Secondary | ICD-10-CM | POA: Diagnosis not present

## 2022-09-09 DIAGNOSIS — R030 Elevated blood-pressure reading, without diagnosis of hypertension: Secondary | ICD-10-CM | POA: Diagnosis not present

## 2022-09-16 ENCOUNTER — Encounter (HOSPITAL_BASED_OUTPATIENT_CLINIC_OR_DEPARTMENT_OTHER): Payer: Self-pay | Admitting: Emergency Medicine

## 2022-09-16 ENCOUNTER — Emergency Department (HOSPITAL_BASED_OUTPATIENT_CLINIC_OR_DEPARTMENT_OTHER)
Admission: EM | Admit: 2022-09-16 | Discharge: 2022-09-16 | Disposition: A | Discharge status: Home / Self Care | Payer: Medicare Other | Attending: Emergency Medicine | Admitting: Emergency Medicine

## 2022-09-16 ENCOUNTER — Emergency Department (HOSPITAL_BASED_OUTPATIENT_CLINIC_OR_DEPARTMENT_OTHER): Payer: Medicare Other | Admitting: Radiology

## 2022-09-16 ENCOUNTER — Other Ambulatory Visit: Payer: Self-pay

## 2022-09-16 DIAGNOSIS — R42 Dizziness and giddiness: Secondary | ICD-10-CM | POA: Diagnosis not present

## 2022-09-16 DIAGNOSIS — I10 Essential (primary) hypertension: Secondary | ICD-10-CM | POA: Insufficient documentation

## 2022-09-16 DIAGNOSIS — I959 Hypotension, unspecified: Secondary | ICD-10-CM | POA: Diagnosis not present

## 2022-09-16 DIAGNOSIS — R079 Chest pain, unspecified: Secondary | ICD-10-CM | POA: Diagnosis not present

## 2022-09-16 DIAGNOSIS — R209 Unspecified disturbances of skin sensation: Secondary | ICD-10-CM | POA: Insufficient documentation

## 2022-09-16 DIAGNOSIS — I499 Cardiac arrhythmia, unspecified: Secondary | ICD-10-CM | POA: Diagnosis not present

## 2022-09-16 DIAGNOSIS — R0789 Other chest pain: Secondary | ICD-10-CM | POA: Diagnosis not present

## 2022-09-16 LAB — BASIC METABOLIC PANEL
Anion gap: 11 (ref 5–15)
BUN: 11 mg/dL (ref 8–23)
CO2: 28 mmol/L (ref 22–32)
Calcium: 9.6 mg/dL (ref 8.9–10.3)
Chloride: 104 mmol/L (ref 98–111)
Creatinine, Ser: 0.74 mg/dL (ref 0.44–1.00)
GFR, Estimated: >60 mL/min (ref >60)
Glucose, Bld: 97 mg/dL (ref 70–99)
Potassium: 4.1 mmol/L (ref 3.5–5.1)
Sodium: 143 mmol/L (ref 135–145)

## 2022-09-16 LAB — LIPASE, BLOOD: Lipase: 35 U/L (ref 11–51)

## 2022-09-16 LAB — HEPATIC FUNCTION PANEL
ALT: 14 U/L (ref 0–44)
AST: 20 U/L (ref 15–41)
Albumin: 4.8 g/dL (ref 3.5–5.0)
Alkaline Phosphatase: 44 U/L (ref 38–126)
Bilirubin, Direct: 0.2 mg/dL (ref 0.0–0.2)
Indirect Bilirubin: 0.7 mg/dL (ref 0.3–0.9)
Total Bilirubin: 0.9 mg/dL (ref 0.3–1.2)
Total Protein: 7.4 g/dL (ref 6.5–8.1)

## 2022-09-16 LAB — TROPONIN I (HIGH SENSITIVITY): Troponin I (High Sensitivity): 3 ng/L (ref <18)

## 2022-09-16 LAB — CBC
HCT: 42.9 % (ref 36.0–46.0)
Hemoglobin: 14.6 g/dL (ref 12.0–15.0)
MCH: 29.7 pg (ref 26.0–34.0)
MCHC: 34 g/dL (ref 30.0–36.0)
MCV: 87.2 fL (ref 80.0–100.0)
Platelets: 216 10*3/uL (ref 150–400)
RBC: 4.92 MIL/uL (ref 3.87–5.11)
RDW: 12.3 % (ref 11.5–15.5)
WBC: 5.7 10*3/uL (ref 4.0–10.5)
nRBC: 0 % (ref 0.0–0.2)

## 2022-09-16 MED ORDER — OMEPRAZOLE 20 MG PO CPDR: 20.0000 mg | DELAYED_RELEASE_CAPSULE | Freq: Every day | ORAL | 1 refills | Status: AC

## 2022-09-16 MED ORDER — SUCRALFATE 1 G PO TABS: 1.0000 g | ORAL_TABLET | Freq: Four times a day (QID) | ORAL | 0 refills | Status: AC | PRN

## 2022-09-16 MED ORDER — ALUM & MAG HYDROXIDE-SIMETH 200-200-20 MG/5ML PO SUSP
30.0000 mL | Freq: Once | ORAL | Status: AC
  Administered 2022-09-16: 30 mL via ORAL
  Filled 2022-09-16: qty 30

## 2022-09-16 NOTE — Discharge Instructions (Signed)
You were evaluated in the Emergency Department and after careful evaluation, we did not find any emergent condition requiring admission or further testing in the hospital.  Your exam/testing today is overall reassuring.  Symptoms may be due to acid reflux.  Take the omeprazole and Carafate medications as we discussed.  We also recommend follow-up with a cardiologist to discuss her symptoms.  Please return to the Emergency Department if you experience any worsening of your condition.   Thank you for allowing [place] to be a part of your care.

## 2022-09-16 NOTE — ED Triage Notes (Signed)
Chest pain down right arm.  GERD symptoms Started 2 weeks

## 2022-09-16 NOTE — ED Provider Notes (Signed)
[facility] Emergency Department Provider Note MRN:  [id number]  Arrival date & time: 09/16/22     Chief Complaint   Chest Pain   History of Present Illness   Dominique Vazquez is a 67 y.o. year-old female with a history of hypertension presenting to the ED with chief complaint of chest pain.  Pain in the center of the chest with occasional tingling to the right side of the face and right hand.  Happening off-and-on over the past 2 weeks.  Worse after meals, worse with lying flat.  Does not get worse with exertion.  Had a little bit of lightheadedness with the pain this evening.  No nausea vomiting, no shortness of breath.  No recent leg pain or swelling.  No abdominal pain.  Review of Systems  A thorough review of systems was obtained and all systems are negative except as noted in the HPI and PMH.   Patient's Health History   History reviewed. No pertinent past medical history.  History reviewed. No pertinent surgical history.  Family History  Problem Relation Age of Onset   Breast cancer Neg Hx     Social History   Socioeconomic History   Marital status: Married    Spouse name: Not on file   Number of children: Not on file   Years of education: Not on file   Highest education level: Not on file  Occupational History   Not on file  Tobacco Use   Smoking status: Never   Smokeless tobacco: Never  Substance and Sexual Activity   Alcohol use: Not Currently   Drug use: Not on file   Sexual activity: Not on file  Other Topics Concern   Not on file  Social History Narrative   Not on file   Social Determinants of Health   Financial Resource Strain: Not on file  Food Insecurity: Not on file  Transportation Needs: Not on file  Physical Activity: Not on file  Stress: Not on file  Social Connections: Not on file  Intimate Partner Violence: Not on file     Physical Exam   Vitals:   09/16/22 1841 09/16/22 2250  BP: (!) 191/81 (!) 172/88  Pulse:  62 (!) 50  Resp: 16 17  Temp: 98.4 F (36.9 C) 98.2 F (36.8 C)  SpO2: 100% 100%    CONSTITUTIONAL: Well-appearing, NAD NEURO/PSYCH:  Alert and oriented x 3, no focal deficits EYES:  eyes equal and reactive ENT/NECK:  no LAD, no JVD CARDIO: Regular rate, well-perfused, normal S1 and S2 PULM:  CTAB no wheezing or rhonchi GI/GU:  non-distended, non-tender MSK/SPINE:  No gross deformities, no edema SKIN:  no rash, atraumatic   *Additional and/or pertinent findings included in MDM below  Diagnostic and Interventional Summary    EKG Interpretation  Date/Time:  Wednesday September 16 2022 18:38:01 EDT Ventricular Rate:  64 PR Interval:  152 QRS Duration: 80 QT Interval:  406 QTC Calculation: 418 R Axis:   84 Text Interpretation: Normal sinus rhythm Possible Left atrial enlargement Borderline ECG No previous ECGs available Confirmed by Kennis Carina [id number]) on 09/16/2022 11:02:00 PM       Labs Reviewed  BASIC METABOLIC PANEL  CBC  LIPASE, BLOOD  HEPATIC FUNCTION PANEL  TROPONIN I (HIGH SENSITIVITY)  TROPONIN I (HIGH SENSITIVITY)    DG Chest 2 View  Final Result      Medications  alum & mag hydroxide-simeth (MAALOX/MYLANTA) 200-200-20 MG/5ML suspension 30 mL (has no administration in time range)  Procedures  /  Critical Care Procedures  ED Course and Medical Decision Making  Initial Impression and Ddx Favoring GI etiology based on description but ACS is also considered.  She has very little cardiovascular risk factors.  Hypertensive on arrival but otherwise reassuring vital signs, no acute distress, no pain at this time.  Past medical/surgical history that increases complexity of ED encounter: Hypertension, GERD  Interpretation of Diagnostics I personally reviewed the EKG and my interpretation is as follows: Sinus rhythm without concerning ischemic features, no prior for comparison  Labs reassuring with no significant blood count or electrolyte disturbance,  troponin negative.  Chest x-ray normal.  Patient Reassessment and Ultimate Disposition/Management     With reassuring workup and low heart score patient is appropriate for discharge, will empirically treat for GERD, refer to cardiology.  Patient management required discussion with the following services or consulting groups:  None  Complexity of Problems Addressed Acute illness or injury that poses threat of life of bodily function  Additional Data Reviewed and Analyzed Further history obtained from: Further history from spouse/family member  Additional Factors Impacting ED Encounter Risk Prescriptions and Consideration of hospitalization  Elmer Sow. Pilar Plate, MD [facility] Emergency Medicine [facility] [email or web address]  Final Clinical Impressions(s) / ED Diagnoses     ICD-10-CM   1. Chest pain, unspecified type  R07.9 Ambulatory referral to Cardiology      ED Discharge Orders          Ordered    sucralfate (CARAFATE) 1 g tablet  4 times daily PRN        09/16/22 2322    omeprazole (PRILOSEC) 20 MG capsule  Daily        09/16/22 2322    Ambulatory referral to Cardiology        09/16/22 2322             Discharge Instructions Discussed with and Provided to Patient:    Discharge Instructions      You were evaluated in the Emergency Department and after careful evaluation, we did not find any emergent condition requiring admission or further testing in the hospital.  Your exam/testing today is overall reassuring.  Symptoms may be due to acid reflux.  Take the omeprazole and Carafate medications as we discussed.  We also recommend follow-up with a cardiologist to discuss her symptoms.  Please return to the Emergency Department if you experience any worsening of your condition.   Thank you for allowing [place] to be a part of your care.      Sabas Sous, MD 09/16/22 [phone]

## 2022-10-05 NOTE — Progress Notes (Unsigned)
Cardiology Office Note:    Date:  10/07/2022   ID:  Dominique Vazquez, DOB September 23, 1955, MRN [id number]  PCP:  Roderick Pee, MD (Inactive)   [facility] Providers Cardiologist:  Alverda Skeans, MD Referring MD: Sabas Sous, MD   Chief Complaint/Reason for Referral: Chest pain  ASSESSMENT:    1. Precordial pain   2. Primary hypertension     PLAN:    In order of problems listed above: 1.  Chest pain: We will obtain a coronary CTA and echocardiogram to evaluate further.  If the patient has mild obstructive coronary artery disease, they will require a statin (with goal LDL < 70) and aspirin, if they have high-grade disease we will need to consider optimal medical therapy and if symptoms are refractory to medical therapy, then a cardiac catheterization with possible PCI will be pursued to alleviate symptoms.  If they have high risk disease we will proceed directly to cardiac catheterization.  For now we will discontinue aspirin monotherapy as no indication for primary prevention. 2.  Hypertension: Will increase amlodipine to 10 mg.             Dispo:  Return if symptoms worsen or fail to improve.      Medication Adjustments/Labs and Tests Ordered: Current medicines are reviewed at length with the patient today.  Concerns regarding medicines are outlined above.  The following changes have been made:     Labs/tests ordered: Orders Placed This Encounter  Procedures   CT CORONARY MORPH W/CTA COR W/SCORE W/CA W/CM &/OR WO/CM   EKG 12-Lead   ECHOCARDIOGRAM COMPLETE    Medication Changes: Meds ordered this encounter  Medications   amLODipine (NORVASC) 10 MG tablet    Sig: Take 1 tablet (10 mg total) by mouth daily.    Dispense:  180 tablet    Refill:  3   metoprolol tartrate (LOPRESSOR) 100 MG tablet    Sig: Take 1 tablet (100 mg total) by mouth once for 1 dose. Take 90-120 minutes prior to scan.    Dispense:  1 tablet    Refill:  0     Current medicines are  reviewed at length with the patient today.  The patient does not have concerns regarding medicines.   History of Present Illness:    FOCUSED PROBLEM LIST:    Hypertension  The patient is a 67 y.o. female with the indicated medical history here for ER follow-up for chest pain.  The patient was seen in the emergency department recently due to chest pain.  Patient reported occasional chest pain and right facial and hand pain.  This did occur occasionally over the last preceding weeks.  Apparently it was worse after meals and with supine position.  It is not worse with exertion.  Evaluation emergency department including EKG and troponin were reassuring.  She is here for further recommendations.  The patient exercises on a routine basis.  She notices that occasionally with exertion but mostly after exertion she will develop a chest discomfort that then goes away after several moments.  She has had occasional lightheadedness when she goes from sitting to standing as well but no frank syncope.  She denies any paroxysmal nocturnal dyspnea, orthopnea, paroxysmal nocturnal dyspnea, signs or symptoms of stroke, or severe bleeding.  She does admit that when she is stressed that she does get chest pain and there is occasionally exertional component.  Of note she has been on aspirin monotherapy for some time for primary prevention but  carries no history of diabetes or cardiovascular disease.       Previous Medical History: Past Medical History:  Diagnosis Date   Chest pain    Closed fracture of distal end of right radius    Estrogen deficiency    Punctate keratitis      Current Medications: Current Meds  Medication Sig   amLODipine (NORVASC) 10 MG tablet Take 1 tablet (10 mg total) by mouth daily.   meloxicam (MOBIC) 7.5 MG tablet Take 1 tablet (7.5 mg total) by mouth daily.   metoprolol tartrate (LOPRESSOR) 100 MG tablet Take 1 tablet (100 mg total) by mouth once for 1 dose. Take 90-120 minutes  prior to scan.   omeprazole (PRILOSEC) 20 MG capsule Take 1 capsule (20 mg total) by mouth daily.   sucralfate (CARAFATE) 1 g tablet Take 1 tablet (1 g total) by mouth 4 (four) times daily as needed.   [DISCONTINUED] amLODipine (NORVASC) 5 MG tablet Take 5 mg by mouth daily.   [DISCONTINUED] aspirin EC 81 MG tablet Take 81 mg by mouth daily. Swallow whole.     Allergies:    Penicillins   Social History:   Social History   Tobacco Use   Smoking status: Never   Smokeless tobacco: Never  Substance Use Topics   Alcohol use: Not Currently     Family Hx: Family History  Problem Relation Age of Onset   Breast cancer Neg Hx      Review of Systems:   Please see the history of present illness.    All other systems reviewed and are negative.     EKGs/Labs/Other Test Reviewed:    EKG: EKG performed June 7 that I personally reviewed demonstrates sinus rhythm with nonspecific ST and T wave changes  EKG Interpretation  Date/Time:  Wednesday October 07 2022 15:44:51 EDT Ventricular Rate:  71 PR Interval:  140 QRS Duration: 86 QT Interval:  374 QTC Calculation: 406 R Axis:   58 Text Interpretation: Sinus rhythm with occasional Premature ventricular complexes When compared with ECG of 16-Sep-2022 18:38, Premature ventricular complexes are now Present Confirmed by Alverda Skeans ([id number]) on 10/07/2022 3:55:15 PM         Prior CV studies: None available     Other studies Reviewed: Review of the additional studies/records demonstrates: All imaging studies reviewed do not demonstrate aortic atherosclerosis or coronary artery calcification  Recent Labs: 09/16/2022: ALT 14; BUN 11; Creatinine, Ser 0.74; Hemoglobin 14.6; Platelets 216; Potassium 4.1; Sodium 143   Recent Lipid Panel Lab Results  Component Value Date/Time   CHOL 190 07/26/2007 11:31 AM   TRIG 52 07/26/2007 11:31 AM   HDL 79.5 07/26/2007 11:31 AM   LDLCALC 100 (H) 07/26/2007 11:31 AM    Risk Assessment/Calculations:            HYPERTENSION CONTROL Vitals:   10/07/22 1549 10/07/22 1607  BP: (!) 184/92 (!) 160/80    The patient's blood pressure is elevated above target today.  In order to address the patient's elevated BP: A current anti-hypertensive medication was adjusted today.       Physical Exam:    VS:  BP (!) 160/80   Pulse 71   Ht 5\' 4"  (1.626 m)   Wt 132 lb 3.2 oz (60 kg)   SpO2 96%   BMI 22.69 kg/m    Wt Readings from Last 3 Encounters:  10/07/22 132 lb 3.2 oz (60 kg)    GENERAL:  No apparent distress, AOx3 HEENT:  No  carotid bruits, +2 carotid impulses, no scleral icterus CAR: RRR no murmurs, gallops, rubs, or thrills RES:  Clear to auscultation bilaterally ABD:  Soft, nontender, nondistended, positive bowel sounds x 4 VASC:  +2 radial pulses, +2 carotid pulses, palpable pedal pulses NEURO:  CN 2-12 grossly intact; motor and sensory grossly intact PSYCH:  No active depression or anxiety EXT:  No edema, ecchymosis, or cyanosis  Signed, Orbie Pyo, MD  10/07/2022 4:55 PM    [facility] [address] [place], [place], [place]  [address] Phone: [phone]; Fax: [phone]   Note:  This document was prepared using Dragon voice recognition software and may include unintentional dictation errors.

## 2022-10-07 ENCOUNTER — Ambulatory Visit: Payer: BC Managed Care – PPO | Attending: Internal Medicine | Admitting: Internal Medicine

## 2022-10-07 ENCOUNTER — Encounter: Payer: Self-pay | Admitting: Internal Medicine

## 2022-10-07 VITALS — BP 160/80 | HR 71 | Ht 64.0 in | Wt 132.2 lb

## 2022-10-07 DIAGNOSIS — I10 Essential (primary) hypertension: Secondary | ICD-10-CM | POA: Diagnosis not present

## 2022-10-07 DIAGNOSIS — R072 Precordial pain: Secondary | ICD-10-CM | POA: Diagnosis not present

## 2022-10-07 MED ORDER — AMLODIPINE BESYLATE 10 MG PO TABS: 10.0000 mg | ORAL_TABLET | Freq: Every day | ORAL | 3 refills | Status: AC

## 2022-10-07 MED ORDER — METOPROLOL TARTRATE 100 MG PO TABS: 100.0000 mg | ORAL_TABLET | Freq: Once | ORAL | 0 refills | Status: AC

## 2022-10-07 NOTE — Patient Instructions (Addendum)
Medication Instructions:  Your physician has recommended you make the following change in your medication:  1.) stop aspirin 2.) increase amlodipine to 10 mg - one tablet daily  *If you need a refill on your cardiac medications before your next appointment, please call your pharmacy*   Lab Work: none If you have labs (blood work) drawn today and your tests are completely normal, you will receive your results only by: MyChart Message (if you have MyChart) OR A paper copy in the mail If you have any lab test that is abnormal or we need to change your treatment, we will call you to review the results.   Testing/Procedures: Cardiac CTA - see instructions below  Your physician has requested that you have an echocardiogram. Echocardiography is a painless test that uses sound waves to create images of your heart. It provides your doctor with information about the size and shape of your heart and how well your heart's chambers and valves are working. This procedure takes approximately one hour. There are no restrictions for this procedure. Please do NOT wear cologne, perfume, aftershave, or lotions (deodorant is allowed). Please arrive 15 minutes prior to your appointment time.    Follow-Up: As needed  Other Instructions   Your cardiac CT will be scheduled at  [facility] [address] [place], [place] [address] [phone]  Please arrive at the [facility] and Children's Entrance (Entrance C2) of [facility] 30 minutes prior to test start time. You can use the FREE valet parking offered at entrance C (encouraged to control the heart rate for the test)  Proceed to the [facility] Radiology Department (first floor) to check-in and test prep.  All radiology patients and guests should use entrance C2 at [facility], accessed from [facility], even though the hospital's physical address listed is [address].      Please follow  these instructions carefully (unless otherwise directed):  An IV will be required for this test and Nitroglycerin will be given.    On the Night Before the Test: Be sure to Drink plenty of water. Do not consume any caffeinated/decaffeinated beverages or chocolate 12 hours prior to your test. Do not take any antihistamines 12 hours prior to your test.  On the Day of the Test: Drink plenty of water until 1 hour prior to the test. Do not eat any food 1 hour prior to test. You may take your regular medications prior to the test.  Take metoprolol (Lopressor) two hours prior to test. FEMALES- please wear underwire-free bra if available, avoid dresses & tight clothing       After the Test: Drink plenty of water. After receiving IV contrast, you may experience a mild flushed feeling. This is normal. On occasion, you may experience a mild rash up to 24 hours after the test. This is not dangerous. If this occurs, you can take Benadryl 25 mg and increase your fluid intake. If you experience trouble breathing, this can be serious. If it is severe call 911 IMMEDIATELY. If it is mild, please call our office. If you take any of these medications: Glipizide/Metformin, Avandament, Glucavance, please do not take 48 hours after completing test unless otherwise instructed.  We will call to schedule your test 2-4 weeks out understanding that some insurance companies will need an authorization prior to the service being performed.   For more information and frequently asked questions, please visit our website : [email or web address]  For non-scheduling related  questions, please contact the cardiac imaging nurse navigator should you have any questions/concerns: Rockwell [place], Cardiac Imaging Nurse Navigator Larey Brick, Cardiac Imaging Nurse Navigator [facility] Direct Office Dial: [phone]   For scheduling needs, including cancellations and rescheduling,  please call [place], [phone].

## 2022-10-22 ENCOUNTER — Encounter (HOSPITAL_COMMUNITY): Payer: Self-pay

## 2022-10-22 ENCOUNTER — Telehealth (HOSPITAL_COMMUNITY): Payer: Self-pay | Admitting: *Deleted

## 2022-10-22 NOTE — Telephone Encounter (Signed)
Reaching out to patient to offer assistance regarding upcoming cardiac imaging study; pt verbalizes understanding of appt date/time, parking situation and where to check in, pre-test NPO status and medications ordered, and verified current allergies; name and call back number provided for further questions should they arise  Alucard Fearnow RN Navigator Cardiac Imaging [facility] [phone] office [phone] cell  Patient to take 100mg metoprolol tartrate two hours prior to her cardiac CT scan.  She is aware to arrive at 2pm. 

## 2022-10-23 ENCOUNTER — Ambulatory Visit (HOSPITAL_COMMUNITY)
Admission: RE | Admit: 2022-10-23 | Discharge: 2022-10-23 | Disposition: A | Discharge status: Home / Self Care | Payer: Medicare Other | Source: Ambulatory Visit | Attending: Internal Medicine | Admitting: Internal Medicine

## 2022-10-23 DIAGNOSIS — R072 Precordial pain: Secondary | ICD-10-CM

## 2022-10-23 MED ORDER — NITROGLYCERIN 0.4 MG SL SUBL
SUBLINGUAL_TABLET | SUBLINGUAL | Status: AC
  Filled 2022-10-23: qty 2

## 2022-10-23 MED ORDER — NITROGLYCERIN 0.4 MG SL SUBL
0.8000 mg | SUBLINGUAL_TABLET | Freq: Once | SUBLINGUAL | Status: AC
  Administered 2022-10-23: 0.8 mg via SUBLINGUAL

## 2022-10-23 MED ORDER — IOHEXOL 350 MG/ML SOLN
95.0000 mL | Freq: Once | INTRAVENOUS | Status: AC | PRN
  Administered 2022-10-23: 95 mL via INTRAVENOUS

## 2022-10-23 NOTE — Progress Notes (Signed)
Patient tolerated CT well.Vital signs stable encourage to drink water throughout day.Reasons explained and verbalized understanding. Ambulated steady gait.   

## 2022-10-29 ENCOUNTER — Ambulatory Visit (HOSPITAL_COMMUNITY): Payer: Medicare Other | Attending: Cardiology

## 2022-10-29 ENCOUNTER — Ambulatory Visit (HOSPITAL_COMMUNITY): Payer: Medicare Other

## 2022-10-29 ENCOUNTER — Encounter (HOSPITAL_COMMUNITY): Payer: Self-pay

## 2022-10-29 DIAGNOSIS — R072 Precordial pain: Secondary | ICD-10-CM | POA: Diagnosis not present

## 2022-10-29 LAB — ECHOCARDIOGRAM COMPLETE
Area-P 1/2: 3.5 cm2
S' Lateral: 2.9 cm

## 2022-11-02 ENCOUNTER — Telehealth: Payer: Self-pay | Admitting: *Deleted

## 2022-11-02 NOTE — Telephone Encounter (Signed)
Received message from CT Navigator that patient left a message w them requesting to review her Cardiac CT results.  I left her a message to call back and send a mychart message to her.

## 2022-11-03 NOTE — Telephone Encounter (Signed)
Called the patient and reviewed her coronary cta results.  She is pleased and voices understanding.

## 2022-11-09 DIAGNOSIS — H25812 Combined forms of age-related cataract, left eye: Secondary | ICD-10-CM | POA: Diagnosis not present

## 2022-11-09 DIAGNOSIS — H524 Presbyopia: Secondary | ICD-10-CM | POA: Diagnosis not present

## 2022-11-09 DIAGNOSIS — H43813 Vitreous degeneration, bilateral: Secondary | ICD-10-CM | POA: Diagnosis not present

## 2022-11-09 DIAGNOSIS — H40013 Open angle with borderline findings, low risk, bilateral: Secondary | ICD-10-CM | POA: Diagnosis not present

## 2022-11-09 DIAGNOSIS — H04123 Dry eye syndrome of bilateral lacrimal glands: Secondary | ICD-10-CM | POA: Diagnosis not present

## 2022-11-30 ENCOUNTER — Telehealth: Payer: Self-pay | Admitting: Internal Medicine

## 2022-11-30 NOTE — Telephone Encounter (Signed)
When patient had her cCTA had IV placed in right arm but they couldn't push contrast through.  Removed IV and used left arm.  She reports she had burning sensation in that arm from fingers up to the iv site (antecubital) and developed a headache during the exam as well.  She continues to on/off feel warm, tinging, burning in right chest, arm and ear and back of her head hurts.   Burning and headache improves w massage and ibuprofen.   I adv her to contact her PCP as this should not be related to her IV site.  Sounds musculoskeletal/nerve related instead.  I did tell her I would pass the information on to the imaging nurses to confirm this should not be related to her CT scan, since that is when she started having symptoms.

## 2022-11-30 NOTE — Telephone Encounter (Signed)
Called patient and let her know CT imaging does not feel likely to be related to the CT/IV.

## 2022-11-30 NOTE — Telephone Encounter (Signed)
Pt is requesting a callback regarding symptoms she's still having since having CT done in July on the 12th. She stated she's been still feeling tingly and headaches. Pt is requesting a callback. Please advise

## 2022-12-07 DIAGNOSIS — K219 Gastro-esophageal reflux disease without esophagitis: Secondary | ICD-10-CM | POA: Diagnosis not present

## 2022-12-07 DIAGNOSIS — I10 Essential (primary) hypertension: Secondary | ICD-10-CM | POA: Diagnosis not present

## 2022-12-07 DIAGNOSIS — R519 Headache, unspecified: Secondary | ICD-10-CM | POA: Diagnosis not present

## 2022-12-07 DIAGNOSIS — M5412 Radiculopathy, cervical region: Secondary | ICD-10-CM | POA: Diagnosis not present

## 2022-12-21 ENCOUNTER — Emergency Department (HOSPITAL_BASED_OUTPATIENT_CLINIC_OR_DEPARTMENT_OTHER)
Admission: EM | Admit: 2022-12-21 | Discharge: 2022-12-21 | Disposition: A | Discharge status: Home / Self Care | Payer: Medicare Other

## 2022-12-21 ENCOUNTER — Emergency Department (HOSPITAL_BASED_OUTPATIENT_CLINIC_OR_DEPARTMENT_OTHER): Payer: Medicare Other

## 2022-12-21 ENCOUNTER — Emergency Department (HOSPITAL_BASED_OUTPATIENT_CLINIC_OR_DEPARTMENT_OTHER): Payer: Medicare Other | Admitting: Radiology

## 2022-12-21 ENCOUNTER — Other Ambulatory Visit: Payer: Self-pay

## 2022-12-21 DIAGNOSIS — G44219 Episodic tension-type headache, not intractable: Secondary | ICD-10-CM | POA: Diagnosis not present

## 2022-12-21 DIAGNOSIS — R519 Headache, unspecified: Secondary | ICD-10-CM | POA: Insufficient documentation

## 2022-12-21 DIAGNOSIS — I10 Essential (primary) hypertension: Secondary | ICD-10-CM | POA: Diagnosis not present

## 2022-12-21 DIAGNOSIS — R202 Paresthesia of skin: Secondary | ICD-10-CM | POA: Insufficient documentation

## 2022-12-21 DIAGNOSIS — Z79899 Other long term (current) drug therapy: Secondary | ICD-10-CM | POA: Insufficient documentation

## 2022-12-21 LAB — CBC
HCT: 41.7 % (ref 36.0–46.0)
Hemoglobin: 13.8 g/dL (ref 12.0–15.0)
MCH: 29.4 pg (ref 26.0–34.0)
MCHC: 33.1 g/dL (ref 30.0–36.0)
MCV: 88.9 fL (ref 80.0–100.0)
Platelets: 190 10*3/uL (ref 150–400)
RBC: 4.69 MIL/uL (ref 3.87–5.11)
RDW: 12.7 % (ref 11.5–15.5)
WBC: 4.7 10*3/uL (ref 4.0–10.5)
nRBC: 0 % (ref 0.0–0.2)

## 2022-12-21 LAB — BASIC METABOLIC PANEL
Anion gap: 7 (ref 5–15)
BUN: 11 mg/dL (ref 8–23)
CO2: 25 mmol/L (ref 22–32)
Calcium: 8.8 mg/dL — ABNORMAL LOW (ref 8.9–10.3)
Chloride: 106 mmol/L (ref 98–111)
Creatinine, Ser: 0.67 mg/dL (ref 0.44–1.00)
GFR, Estimated: >60 mL/min (ref >60)
Glucose, Bld: 100 mg/dL — ABNORMAL HIGH (ref 70–99)
Potassium: 3.5 mmol/L (ref 3.5–5.1)
Sodium: 138 mmol/L (ref 135–145)

## 2022-12-21 LAB — TROPONIN I (HIGH SENSITIVITY)
Troponin I (High Sensitivity): 2 ng/L (ref <18)
Troponin I (High Sensitivity): 3 ng/L (ref <18)

## 2022-12-21 MED ORDER — KETOROLAC TROMETHAMINE 15 MG/ML IJ SOLN
15.0000 mg | Freq: Once | INTRAMUSCULAR | Status: AC
  Administered 2022-12-21: 15 mg via INTRAVENOUS
  Filled 2022-12-21: qty 1

## 2022-12-21 MED ORDER — DIPHENHYDRAMINE HCL 50 MG/ML IJ SOLN
12.5000 mg | Freq: Once | INTRAMUSCULAR | Status: AC
  Administered 2022-12-21: 12.5 mg via INTRAVENOUS
  Filled 2022-12-21: qty 1

## 2022-12-21 MED ORDER — SODIUM CHLORIDE 0.9 % IV BOLUS
1000.0000 mL | Freq: Once | INTRAVENOUS | Status: AC
  Administered 2022-12-21: 1000 mL via INTRAVENOUS

## 2022-12-21 MED ORDER — METOCLOPRAMIDE HCL 5 MG/ML IJ SOLN
10.0000 mg | Freq: Once | INTRAMUSCULAR | Status: AC
  Administered 2022-12-21: 10 mg via INTRAVENOUS
  Filled 2022-12-21: qty 2

## 2022-12-21 MED ORDER — AMLODIPINE BESYLATE 5 MG PO TABS
10.0000 mg | ORAL_TABLET | Freq: Once | ORAL | Status: AC
  Administered 2022-12-21: 10 mg via ORAL
  Filled 2022-12-21: qty 2

## 2022-12-21 MED ORDER — HYDRALAZINE HCL 20 MG/ML IJ SOLN
5.0000 mg | Freq: Once | INTRAMUSCULAR | Status: AC
  Administered 2022-12-21: 5 mg via INTRAVENOUS
  Filled 2022-12-21: qty 1

## 2022-12-21 NOTE — Discharge Instructions (Signed)
You have been seen today for your complaint of headache. Your lab work was reassuring. Your imaging was reassuring. Your discharge medications include Alternate tylenol and ibuprofen for pain. You may alternate these every 4 hours. You may take up to 800 mg of ibuprofen at a time and up to 1000 mg of tylenol. Follow up with: Your primary care provider in 1 week for reevaluation Please seek immediate medical care if you develop any of the following symptoms: Your headache: Gets very bad quickly. Gets worse after a lot of physical activity. You have any of these symptoms: You continue to vomit. A stiff neck. Trouble seeing. Your eye or ear hurts. Trouble speaking. Weak muscles or you lose muscle control. You lose your balance or have trouble walking. You feel like you will pass out (faint) or you pass out. You are mixed up (confused). You have a seizure. At this time there does not appear to be the presence of an emergent medical condition, however there is always the potential for conditions to change. Please read and follow the below instructions.  Do not take your medicine if  develop an itchy rash, swelling in your mouth or lips, or difficulty breathing; call 911 and seek immediate emergency medical attention if this occurs.  You may review your lab tests and imaging results in their entirety on your MyChart account.  Please discuss all results of fully with your primary care provider and other specialist at your follow-up visit.  Note: Portions of this text may have been transcribed using voice recognition software. Every effort was made to ensure accuracy; however, inadvertent computerized transcription errors may still be present.

## 2022-12-21 NOTE — ED Provider Notes (Signed)
[facility]ENC[facility] DEPARTMENT AT [facility] Provider Note   CSN: [id number] Arrival date & time: 12/21/22  1556     History  No chief complaint on file.   Dominique Vazquez is a 67 y.o. female.  With history of migraine headaches, hypertension who presents to the ED for evaluation of paresthesias to the right side of her body and headaches.  Symptoms have been present for the past 2 to 3 months.  She states that symptoms are intermittent, but occur most days out of the week.  She did not have any symptoms yesterday.  States she woke up today with a tingling sensation to the right side of her face, neck, right upper extremity, right upper leg.  This is described as a pins-and-needles sensation.  She denies any numbness or weakness.  No facial droop, slurred speech, vision changes.  States she has not taken her antihypertensives today.  Typical blood pressure is 140/90.  She states she feels like her right eye is swollen as well.  Discussed her to present to her optometrist who then referred her to the emergency department for further evaluation.  She rates her headache a 7 out of 10 at this time.  She has not taken anything for her symptoms prior to arrival.  HPI     Home Medications Prior to Admission medications   Medication Sig Start Date End Date Taking? Authorizing Provider  amLODipine (NORVASC) 10 MG tablet Take 1 tablet (10 mg total) by mouth daily. 10/07/22 01/05/23  Orbie Pyo, MD  meloxicam (MOBIC) 7.5 MG tablet Take 1 tablet (7.5 mg total) by mouth daily. 09/13/18   Cathie Hoops, Amy V, PA-C  metoprolol tartrate (LOPRESSOR) 100 MG tablet Take 1 tablet (100 mg total) by mouth once for 1 dose. Take 90-120 minutes prior to scan. 10/07/22 10/07/22  Orbie Pyo, MD  omeprazole (PRILOSEC) 20 MG capsule Take 1 capsule (20 mg total) by mouth daily. 09/16/22   Sabas Sous, MD  sucralfate (CARAFATE) 1 g tablet Take 1 tablet (1 g total) by mouth 4 (four) times daily as needed. 09/16/22    Sabas Sous, MD      Allergies    Penicillins    Review of Systems   Review of Systems  Neurological:  Positive for numbness and headaches.  All other systems reviewed and are negative.   Physical Exam Updated Vital Signs BP (!) 159/77   Pulse 63   Temp 98.3 F (36.8 C)   Resp 17   SpO2 100%  Physical Exam Vitals and nursing note reviewed.  Constitutional:      General: She is not in acute distress.    Appearance: She is well-developed. She is not ill-appearing, toxic-appearing or diaphoretic.     Comments: Resting comfortably in bed  HENT:     Head: Normocephalic and atraumatic.  Eyes:     Conjunctiva/sclera: Conjunctivae normal.     Pupils: Pupils are equal, round, and reactive to light.  Cardiovascular:     Rate and Rhythm: Normal rate and regular rhythm.     Heart sounds: No murmur heard. Pulmonary:     Effort: Pulmonary effort is normal. No respiratory distress.     Breath sounds: Normal breath sounds. No wheezing, rhonchi or rales.  Abdominal:     Palpations: Abdomen is soft.     Tenderness: There is no abdominal tenderness.  Musculoskeletal:        General: No swelling.     Cervical back:  Neck supple.  Skin:    General: Skin is warm and dry.     Capillary Refill: Capillary refill takes less than 2 seconds.  Neurological:     General: No focal deficit present.     Mental Status: She is alert and oriented to person, place, and time.     Comments:   MENTAL STATUS: AAOx3   LANG/SPEECH: Fluent, intact naming, repetition & comprehension   CRANIAL NERVES:   II: Pupils equal and reactive   III, IV, VI: EOM intact, no gaze preference or deviation, no nystagmus   V: normal sensation of the face   VII: no facial asymmetry   VIII: normal hearing to speech   MOTOR: 5/5 in both upper and lower extremities   SENSORY: Normal to touch in all extremiteis   COORD: Normal finger to nose, heel to shin and shoulder shrug, no tremor, no dysmetria. No pronator drift    Psychiatric:        Mood and Affect: Mood normal.     ED Results / Procedures / Treatments   Labs (all labs ordered are listed, but only abnormal results are displayed) Labs Reviewed  BASIC METABOLIC PANEL - Abnormal; Notable for the following components:      Result Value   Glucose, Bld 100 (*)    Calcium 8.8 (*)    All other components within normal limits  CBC  TROPONIN I (HIGH SENSITIVITY)  TROPONIN I (HIGH SENSITIVITY)    EKG EKG Interpretation Date/Time:  Monday December 21 2022 16:06:18 EDT Ventricular Rate:  68 PR Interval:  144 QRS Duration:  88 QT Interval:  408 QTC Calculation: 433 R Axis:   77  Text Interpretation: Normal sinus rhythm Abnormal ECG When compared with ECG of 07-Oct-2022 15:44, Premature ventricular complexes are no longer Present Confirmed by Estanislado Pandy [id number]) on 12/21/2022 7:10:13 PM  Radiology CT Head Wo Contrast  Result Date: 12/21/2022 CLINICAL DATA:  Headache and right-sided facial tingling. EXAM: CT HEAD WITHOUT CONTRAST TECHNIQUE: Contiguous axial images were obtained from the base of the skull through the vertex without intravenous contrast. RADIATION DOSE REDUCTION: This exam was performed according to the departmental dose-optimization program which includes automated exposure control, adjustment of the mA and/or kV according to patient size and/or use of iterative reconstruction technique. COMPARISON:  None Available. FINDINGS: Brain: No evidence of acute infarction, hemorrhage, hydrocephalus, extra-axial collection or mass lesion/mass effect. Vascular: No hyperdense vessel or unexpected calcification. Skull: Normal. Negative for fracture or focal lesion. Sinuses/Orbits: No acute finding.  The right lens is not identified. Other: None. IMPRESSION: 1. No acute intracranial pathology. Electronically Signed   By: Aram Candela M.D.   On: 12/21/2022 23:03   DG Chest 2 View  Result Date: 12/21/2022 CLINICAL DATA:  Headache with right-sided  facial tingling. EXAM: CHEST - 2 VIEW COMPARISON:  September 16, 2022 FINDINGS: The heart size and mediastinal contours are within normal limits. Both lungs are clear. The visualized skeletal structures are unremarkable. IMPRESSION: No active cardiopulmonary disease. Electronically Signed   By: Aram Candela M.D.   On: 12/21/2022 19:14    Procedures Procedures    Medications Ordered in ED Medications  hydrALAZINE (APRESOLINE) injection 5 mg (5 mg Intravenous Given 12/21/22 2124)  amLODipine (NORVASC) tablet 10 mg (10 mg Oral Given 12/21/22 2100)  sodium chloride 0.9 % bolus 1,000 mL (0 mLs Intravenous Stopped 12/21/22 2259)  metoCLOPramide (REGLAN) injection 10 mg (10 mg Intravenous Given 12/21/22 2118)  diphenhydrAMINE (BENADRYL) injection 12.5 mg (12.5  mg Intravenous Given 12/21/22 2121)  ketorolac (TORADOL) 15 MG/ML injection 15 mg (15 mg Intravenous Given 12/21/22 2121)    ED Course/ Medical Decision Making/ A&P                                 Medical Decision Making Amount and/or Complexity of Data Reviewed Labs: ordered. Radiology: ordered.  Risk Prescription drug management.   This patient presents to the ED for concern of headache, paresthesias, this involves an extensive number of treatment options, and is a complaint that carries with it a high risk of complications and morbidity.  Emergent considerations for headache include subarachnoid hemorrhage, meningitis, temporal arteritis, glaucoma, cerebral ischemia, carotid/vertebral dissection, intracranial tumor, Venous sinus thrombosis, carbon monoxide poisoning, acute or chronic subdural hemorrhage.  Other considerations include: Migraine, Cluster headache, Hypertension, Caffeine, alcohol, or drug withdrawal, Pseudotumor cerebri, Arteriovenous malformation, Head injury, Neurocysticercosis, Post-lumbar puncture, Preeclampsia, Tension headache, Sinusitis, Cervical arthritis, Refractive error causing strain, Dental abscess, Otitis media,  Temporomandibular joint syndrome, Depression, Somatoform disorder (eg, somatization) Trigeminal neuralgia, Glossopharyngeal neuralgia.   My initial workup includes labs, imaging, EKG, symptom control  Additional history obtained from: Nursing notes from this visit. Family husband at bedside provides a portion of the history  I ordered, reviewed and interpreted labs which include: CBC, BMP, troponin, delta troponin  I ordered imaging studies including chest x-ray, CT head I independently visualized and interpreted imaging which showed negative chest x-ray and CT head I agree with the radiologist interpretation  Cardiac Monitoring:  The patient was maintained on a cardiac monitor.  I personally viewed and interpreted the cardiac monitored which showed an underlying rhythm of: NSR  Afebrile, initially significantly hypertensive which was treated in the emergency department.  Otherwise hemodynamically stable.  67 year old female presenting to the ED for evaluation of intermittent headaches and right-sided paresthesias for the past 2 to 3 months.  She was sent by her optometrist for further evaluation and to rule out a stroke.  She reported a 7 out of 10 headache localized to the right side of her head on arrival.  She was treated with migraine cocktail and reported resolution of her symptoms.  She did not have any numbness or paresthesias on my exam.  She had a nonfocal neurologic exam and appears very well.  Very low suspicion for acute intracranial abnormalities at this time.  Overall suspect an atypical migraine.  She was encouraged to follow-up with her primary care provider as soon as possible.  She was given return precautions.  Stable at discharge.  At this time there does not appear to be any evidence of an acute emergency medical condition and the patient appears stable for discharge with appropriate outpatient follow up. Diagnosis was discussed with patient who verbalizes understanding of  care plan and is agreeable to discharge. I have discussed return precautions with patient and significant other who verbalizes understanding. Patient encouraged to follow-up with their PCP within 1 week. All questions answered.  Patient's case discussed with Dr. Maple Hudson who agrees with plan to discharge with follow-up.   Note: Portions of this report may have been transcribed using voice recognition software. Every effort was made to ensure accuracy; however, inadvertent computerized transcription errors may still be present.        Final Clinical Impression(s) / ED Diagnoses Final diagnoses:  Bad headache  Paresthesias    Rx / DC Orders ED Discharge Orders  Ordered    Ambulatory referral to Neurology       Comments: An appointment is requested in approximately: 2 weeks   12/21/22 2334              Michelle Piper, PA-C 12/21/22 2334    Coral Spikes, DO 12/21/22 2345

## 2022-12-21 NOTE — ED Notes (Signed)
Pt ambulatory to restroom with steady gait.

## 2022-12-21 NOTE — ED Notes (Signed)
Reviewed AVS with patient, patient expressed understanding of directions, denies further questions at this time. 

## 2022-12-21 NOTE — ED Notes (Signed)
Pt to CT at this time by wheelchair.

## 2022-12-21 NOTE — ED Triage Notes (Addendum)
Symptoms ongoing 2-3 months. Headache accompanied by tingling of right face. Complains of eye irritation and ear fullness. All symptoms have come and gone for 3 months. No definite change can be noted today.  Young MD consulted for triage.

## 2022-12-23 ENCOUNTER — Ambulatory Visit (INDEPENDENT_AMBULATORY_CARE_PROVIDER_SITE_OTHER): Payer: Medicare Other | Admitting: Neurology

## 2022-12-23 ENCOUNTER — Encounter: Payer: Self-pay | Admitting: Neurology

## 2022-12-23 VITALS — BP 129/78 | HR 80 | Wt 136.5 lb

## 2022-12-23 DIAGNOSIS — M542 Cervicalgia: Secondary | ICD-10-CM | POA: Diagnosis not present

## 2022-12-23 DIAGNOSIS — G43709 Chronic migraine without aura, not intractable, without status migrainosus: Secondary | ICD-10-CM | POA: Diagnosis not present

## 2022-12-23 MED ORDER — GABAPENTIN 100 MG PO CAPS: 100.0000 mg | ORAL_CAPSULE | Freq: Three times a day (TID) | ORAL | 5 refills | Status: DC | PRN

## 2022-12-23 NOTE — Progress Notes (Signed)
Chief Complaint  Patient presents with   New Patient (Initial Visit)    Rm emg 3 daughter Morrison Old present NP proficient paper referral for Headache:28/30 days w/ha , diagnosed w/ migraines in twenties: pt stated that she hurts on right side of head and eye that radiates in shocking way on right neck and shoulder that she can also feel in right breast and tingly in right arm fingers      ASSESSMENT AND PLAN  Dominique Vazquez is a 67 y.o. female   Right neck pain, radiating pain to right shoulder, upper extremity, hand,  Most suggestive of right cervical radiculopathy  MRI cervical spine  Gabapentin 100 mg 3 times daily as needed, may combine with Aleve as needed  Depend on what MRI shows, will make decision about follow-up  DIAGNOSTIC DATA (LABS, IMAGING, TESTING) - I reviewed patient records, labs, notes, testing and imaging myself where available.   MEDICAL HISTORY:  Dominique Vazquez, is a 67 year old female seen in request by her primary care from [facility] Dr. Wynelle Link, Charise Carwin, for evaluation of neck pain radiating pain to upper extremity, accompanied by her daughter at today's visit December 23, 2022  I reviewed and summarized the referring note.PMHX HTN GERD Right wrist fracture  She had long history of chronic mild brain, typical migraine is right retro-orbital area pounding headache with light, noise sensitivity, nauseous, lasting for few hours, resolved by Maxalt, she has not had typical migraine for years  In July 2024, she fell to 1 sharp pain at the right scalp region, only last for few seconds, since then, she has frequent right neck pain, radiating pain to right upper scapular region, sometimes to right upper chest, right arm, involving whole right hand, also spreading upwards to right occipital region, she denies weakness, no gait abnormality  But complains of difficulty sleeping because neck discomfort,  Laboratory evaluation in October 2023, normal CMP creatinine  0.85,  PHYSICAL EXAM:   Vitals:   12/23/22 1502 12/23/22 1505  BP: 136/87 129/78  Pulse: 85 80  Weight: 136 lb 8 oz (61.9 kg)    Body mass index is 23.43 kg/m.  PHYSICAL EXAMNIATION:  Gen: NAD, conversant, well nourised, well groomed                     Cardiovascular: Regular rate rhythm, no peripheral edema, warm, nontender. Eyes: Conjunctivae clear without exudates or hemorrhage Neck: Supple, no carotid bruits. Pulmonary: Clear to auscultation bilaterally   NEUROLOGICAL EXAM:  MENTAL STATUS: Speech/cognition: Awake, alert, oriented to history taking and casual conversation CRANIAL NERVES: CN II: Visual fields are full to confrontation. Pupils are round equal and briskly reactive to light. CN III, IV, VI: extraocular movement are normal. No ptosis. CN V: Facial sensation is intact to light touch CN VII: Face is symmetric with normal eye closure  CN VIII: Hearing is normal to causal conversation. CN IX, X: Phonation is normal. CN XI: Head turning and shoulder shrug are intact  MOTOR: There is no pronator drift of out-stretched arms. Muscle bulk and tone are normal. Muscle strength is normal.  REFLEXES: Reflexes are 2+ and symmetric at the biceps, triceps, knees, and ankles. Plantar responses are flexor.  SENSORY: Intact to light touch, pinprick and vibratory sensation are intact in fingers and toes.  COORDINATION: There is no trunk or limb dysmetria noted.  GAIT/STANCE: Posture is normal. Gait is steady with normal steps, base, arm swing, and turning. Heel and toe walking are normal. Tandem  gait is normal.  Romberg is absent.  REVIEW OF SYSTEMS:  Full 14 system review of systems performed and notable only for as above All other review of systems were negative.   ALLERGIES: Allergies  Allergen Reactions   Penicillins     REACTION: swelling    HOME MEDICATIONS: Current Outpatient Medications  Medication Sig Dispense Refill   amLODipine (NORVASC) 10 MG  tablet Take 1 tablet (10 mg total) by mouth daily. 180 tablet 3   meloxicam (MOBIC) 7.5 MG tablet Take 1 tablet (7.5 mg total) by mouth daily. 15 tablet 0   metoprolol tartrate (LOPRESSOR) 100 MG tablet Take 1 tablet (100 mg total) by mouth once for 1 dose. Take 90-120 minutes prior to scan. 1 tablet 0   omeprazole (PRILOSEC) 20 MG capsule Take 1 capsule (20 mg total) by mouth daily. 30 capsule 1   sucralfate (CARAFATE) 1 g tablet Take 1 tablet (1 g total) by mouth 4 (four) times daily as needed. 30 tablet 0   No current facility-administered medications for this visit.    PAST MEDICAL HISTORY: Past Medical History:  Diagnosis Date   Chest pain    Closed fracture of distal end of right radius    Estrogen deficiency    Punctate keratitis     PAST SURGICAL HISTORY: History reviewed. No pertinent surgical history.  FAMILY HISTORY: Family History  Problem Relation Age of Onset   Breast cancer Neg Hx     SOCIAL HISTORY: Social History   Socioeconomic History   Marital status: Married    Spouse name: Not on file   Number of children: Not on file   Years of education: Not on file   Highest education level: Not on file  Occupational History   Not on file  Tobacco Use   Smoking status: Never   Smokeless tobacco: Never  Substance and Sexual Activity   Alcohol use: Not Currently   Drug use: Not on file   Sexual activity: Not on file  Other Topics Concern   Not on file  Social History Narrative   Not on file   Social Determinants of Health   Financial Resource Strain: Not on file  Food Insecurity: Not on file  Transportation Needs: Not on file  Physical Activity: Not on file  Stress: Not on file  Social Connections: Unknown (08/25/2021)   Received from [facility]   Social Network    Social Network: Not on file  Intimate Partner Violence: Unknown (07/17/2021)   Received from [facility]   HITS    Physically Hurt: Not on file    Insult or Talk Down To: Not on file     Threaten Physical Harm: Not on file    Scream or Curse: Not on file      Levert Feinstein, M.D. Ph.D.  [facility] [address] [place], [place] [address] Ph: [phone] Fax: [phone]  CC:  Deatra James, MD [phone] [address] [place],  [place] [address]  Deatra James, MD

## 2022-12-24 ENCOUNTER — Telehealth: Payer: Self-pay | Admitting: Neurology

## 2022-12-24 NOTE — Telephone Encounter (Signed)
[employer] Berkley Harvey: [id number] exp. 12/24/22-01/22/23, [facility] medicare NPR sent to GI [phone]

## 2023-01-07 ENCOUNTER — Ambulatory Visit
Admission: RE | Admit: 2023-01-07 | Discharge: 2023-01-07 | Disposition: A | Discharge status: Home / Self Care | Payer: Medicare Other | Source: Ambulatory Visit | Attending: Neurology | Admitting: Neurology

## 2023-01-07 DIAGNOSIS — M542 Cervicalgia: Secondary | ICD-10-CM

## 2023-01-11 ENCOUNTER — Telehealth: Payer: Self-pay | Admitting: Neurology

## 2023-01-11 DIAGNOSIS — M542 Cervicalgia: Secondary | ICD-10-CM

## 2023-01-11 DIAGNOSIS — H04123 Dry eye syndrome of bilateral lacrimal glands: Secondary | ICD-10-CM | POA: Diagnosis not present

## 2023-01-11 DIAGNOSIS — H401232 Low-tension glaucoma, bilateral, moderate stage: Secondary | ICD-10-CM | POA: Diagnosis not present

## 2023-01-11 DIAGNOSIS — R519 Headache, unspecified: Secondary | ICD-10-CM | POA: Diagnosis not present

## 2023-01-11 MED ORDER — GABAPENTIN 300 MG PO CAPS: 300.0000 mg | ORAL_CAPSULE | Freq: Three times a day (TID) | ORAL | 5 refills | Status: AC

## 2023-01-11 NOTE — Telephone Encounter (Signed)
Called and Left detailed message per [facility]

## 2023-01-11 NOTE — Telephone Encounter (Signed)
Pt said had MRI on 01/07/23,  Still have pain in right side:neck, ear and arm. Pain comes and goes, taking  gabapentin (NEURONTIN) 100 MG capsule and is not working.

## 2023-01-11 NOTE — Telephone Encounter (Signed)
Call patient MRI of the cervical spine showed only mild degenerative changes there was no evidence of the spinal nerve or cord compression  I have written higher dose of gabapentin 300 mg 3 times a day  She may combine it with as needed Mobic, or over-the-counter Aleve, warm compression, neck stretching exercise,  I also referred her for physical therapy  IMPRESSION: MRI scan of cervical spine without contrast showing mild disc degenerative changes from C3-C6 with without significant compression.

## 2023-01-11 NOTE — Telephone Encounter (Signed)
Call to patient, she reports continued pain in her neck, ear and arm on the right said. She is taking the gabapentin 100 mg 3x daily but only gets about 30 minutes of relief.  Advised I would send to Dr. Terrace Arabia for review and recommendations. Patient appreciative of call

## 2023-01-14 ENCOUNTER — Encounter (HOSPITAL_COMMUNITY): Payer: Self-pay

## 2023-01-14 ENCOUNTER — Ambulatory Visit (HOSPITAL_COMMUNITY): Admission: RE | Admit: 2023-01-14 | Payer: Medicare Other | Source: Ambulatory Visit

## 2023-01-14 ENCOUNTER — Emergency Department (HOSPITAL_COMMUNITY)
Admission: EM | Admit: 2023-01-14 | Discharge: 2023-01-14 | Disposition: A | Discharge status: Home / Self Care | Payer: Medicare Other | Attending: Emergency Medicine | Admitting: Emergency Medicine

## 2023-01-14 ENCOUNTER — Telehealth: Payer: Self-pay | Admitting: Neurology

## 2023-01-14 ENCOUNTER — Other Ambulatory Visit: Payer: Self-pay

## 2023-01-14 DIAGNOSIS — R519 Headache, unspecified: Secondary | ICD-10-CM | POA: Diagnosis not present

## 2023-01-14 LAB — BASIC METABOLIC PANEL
Anion gap: 10 (ref 5–15)
BUN: 13 mg/dL (ref 8–23)
CO2: 25 mmol/L (ref 22–32)
Calcium: 9 mg/dL (ref 8.9–10.3)
Chloride: 102 mmol/L (ref 98–111)
Creatinine, Ser: 0.71 mg/dL (ref 0.44–1.00)
GFR, Estimated: >60 mL/min (ref >60)
Glucose, Bld: 98 mg/dL (ref 70–99)
Potassium: 3.6 mmol/L (ref 3.5–5.1)
Sodium: 137 mmol/L (ref 135–145)

## 2023-01-14 LAB — CBC
HCT: 40.4 % (ref 36.0–46.0)
Hemoglobin: 13.4 g/dL (ref 12.0–15.0)
MCH: 30 pg (ref 26.0–34.0)
MCHC: 33.2 g/dL (ref 30.0–36.0)
MCV: 90.4 fL (ref 80.0–100.0)
Platelets: 190 10*3/uL (ref 150–400)
RBC: 4.47 MIL/uL (ref 3.87–5.11)
RDW: 12.4 % (ref 11.5–15.5)
WBC: 5.3 10*3/uL (ref 4.0–10.5)
nRBC: 0 % (ref 0.0–0.2)

## 2023-01-14 LAB — MAGNESIUM: Magnesium: 2.1 mg/dL (ref 1.7–2.4)

## 2023-01-14 NOTE — Telephone Encounter (Signed)
Called and spoke to patient, she states the right sided pain just started last night with numbness and tingling, she reports she does know of anything that would have triggered it. She has had the pain in her neck for two months, nothing has helped the pain and she is still having it. I advised the ER but said I would forward to the provider for more info.

## 2023-01-14 NOTE — ED Triage Notes (Addendum)
Patient stated she has had a right sided headache along with right arm pain for 2 months that moves around her back. Noticed a bruise on her right bicep. No falls. No blood thinners. Seen last week at [facility] for same symptoms. Had MRI. Feels pressure in her eyes.

## 2023-01-14 NOTE — ED Provider Notes (Signed)
[facility] AT [facility] Provider Note   CSN: [id number] Arrival date & time: 01/14/23  1425     History Chief Complaint  Patient presents with   Headache    HPI SHANYN PREISLER is a 67 y.o. female presenting for evaluation of Headache that has been present for several months. Has been evaluated in the ED and also by neurology. CT head has been unremarkable. MRI neck did not show much concern for cervical radiculopathy. Finds benefit with gabapentin most recently increased to 300mg . Pain is throbbing and associated with paresthesias that will start on the right scalp behind the eye and radiate to her ears, maxilla, throat, down her right arm. No nausea, vomiting. Has eye pain and recently told she has glaucoma. Not like her past migraine headaches that would include photosensitivity, noise sensitivity and nausea.   States that these get better when she wakes up, worse around 10am-12pm, then get a little better throughout the day. Never goes away. Denies motor weakness, tongue biting, incontinence, LOC. Husband denies this happening in her sleep too.    States that she had a significant stressor before this happened. No changes in prescribed medications, she takes potassium, centrum, magnesium, and B12 over the counter. Started magnesium around this time as well.   Patient's recorded medical, surgical, social, medication list and allergies were reviewed in the Snapshot window as part of the initial history.   Review of Systems   Review of Systems  Constitutional:  Negative for chills and fever.  Eyes:  Positive for pain. Negative for photophobia, redness and visual disturbance.  Respiratory:  Negative for cough, chest tightness and shortness of breath.   Cardiovascular:  Negative for chest pain, palpitations and leg swelling.  Gastrointestinal:  Negative for abdominal pain, diarrhea, nausea and vomiting.  Genitourinary:  Negative for difficulty urinating  and dysuria.  Musculoskeletal:  Positive for neck pain. Negative for gait problem.  Neurological:  Positive for numbness and headaches. Negative for seizures.  Psychiatric/Behavioral:  The patient is nervous/anxious.    Physical Exam Updated Vital Signs BP (!) 141/95 (BP Location: Left Arm)   Pulse 73   Temp 98.1 F (36.7 C) (Oral)   Resp 18   Ht 5\' 4"  (1.626 m)   Wt 63.5 kg   SpO2 100%   BMI 24.03 kg/m  Physical Exam Constitutional:      General: She is not in acute distress.    Appearance: She is not ill-appearing or toxic-appearing.  Eyes:     General: No visual field deficit. Cardiovascular:     Rate and Rhythm: Normal rate. Rhythm irregular.     Heart sounds: No murmur heard.    No friction rub. No gallop.  Pulmonary:     Effort: No respiratory distress.     Breath sounds: No wheezing, rhonchi or rales.  Abdominal:     General: There is no distension.     Palpations: Abdomen is soft. There is no mass.     Tenderness: There is no abdominal tenderness.  Musculoskeletal:        General: No swelling or tenderness.     Right lower leg: No edema.     Left lower leg: No edema.  Skin:    General: Skin is warm.  Neurological:     Mental Status: She is alert and oriented to person, place, and time.     Cranial Nerves: No cranial nerve deficit, dysarthria or facial asymmetry.     Sensory:  Sensation is intact.     Motor: Motor function is intact. No weakness, tremor, abnormal muscle tone or pronator drift.     Coordination: Finger-Nose-Finger Test and Heel to [employer] normal.     Gait: Gait normal.    ED Course/ Medical Decision Making/ A&P Clinical Course as of 01/14/23 1919  Thu Jan 14, 2023  1815 Stable 88 YOF with headaches HX of migraines Has had 1 month of symptoms. Worse in morning. Present over 1 month. Referred to neuro. Neck MRI nondiagnostic.  [CC]    Clinical Course User Index [CC] Glyn Ade, MD    Medications Ordered in ED Medications - No  data to display  Medical Decision Making:    SILVANNA OHMER is a 67 y.o. female who presented to the ED today with complaints of paresthesias and headache as detailed above.     Complete initial physical exam performed, notably the patient  was neurologically intact.   Reviewed and confirmed nursing documentation for past medical history, family history, social history.    Initial Assessment:   With the patient's presentation of atypical headache the differential diagnosis can be broad. Will rule out mets with MRI, due to worsening sxs in the AM. Has found some relief with gabapentin, could explain pain on shoulders. MRI scan of cervical spine without contrast in the past showed mild disc degenerative changes from C3-C6 with without significant compression.  Could be due to OTC supplements she is taking or recent stressors leading to conversion do. Seizure unlikely given that she is not post-ictal afterwards and atypical presentation. Less likely stroke given that CT head has been unremarkable in the past.   Initial Plan:  MRI to evaluate brain for tumor as the cause of symptoms. Screening labs including CBC and Metabolic panel to evaluate for infectious or metabolic etiology of disease.  Magnesium and B12 levels  Objective evaluation as below reviewed with plan for close reassessment  Initial Study Results:   Laboratory  All laboratory results reviewed without evidence of clinically relevant pathology.  Results for orders placed or performed during the hospital encounter of 01/14/23  Basic metabolic panel  Result Value Ref Range   Sodium 137 135 - 145 mmol/L   Potassium 3.6 3.5 - 5.1 mmol/L   Chloride 102 98 - 111 mmol/L   CO2 25 22 - 32 mmol/L   Glucose, Bld 98 70 - 99 mg/dL   BUN 13 8 - 23 mg/dL   Creatinine, Ser 8.29 0.44 - 1.00 mg/dL   Calcium 9.0 8.9 - 56.2 mg/dL   GFR, Estimated >13 >08 mL/min   Anion gap 10 5 - 15  CBC  Result Value Ref Range   WBC 5.3 4.0 - 10.5 K/uL    RBC 4.47 3.87 - 5.11 MIL/uL   Hemoglobin 13.4 12.0 - 15.0 g/dL   HCT 65.7 84.6 - 96.2 %   MCV 90.4 80.0 - 100.0 fL   MCH 30.0 26.0 - 34.0 pg   MCHC 33.2 30.0 - 36.0 g/dL   RDW 95.2 84.1 - 32.4 %   Platelets 190 150 - 400 K/uL   nRBC 0.0 0.0 - 0.2 %  Magnesium  Result Value Ref Range   Magnesium 2.1 1.7 - 2.4 mg/dL   MR CERVICAL SPINE WO CONTRAST  Result Date: 01/07/2023  [facility] [address] [place], [place] [address] [phone] NEUROIMAGING REPORT STUDY DATE: 01/07/2023 PATIENT NAME: KEAGAN ANTHIS DOB: 1955-09-02 MRN: [id number] ORDERING CLINICIAN: Dr. Terrace Arabia  CLINICAL HISTORY: 67 year old patient being evaluated for neck pain COMPARISON FILMS: None  EXAM: MRI cervical spine without contrast TECHNIQUE: Sagittal T1, T2, STIR and axial medic and T2 images were obtained through cervical spine CONTRAST: None IMAGING SITE: [facility] FINDINGS: The cervical vertebrae demonstrate slight loss of forward lordotic curvature but normal body height and marrow signal characteristics.  The intervertebral disc show mild disc degenerative changes throughout. C2-3 shows mild loss of disc height but no significant compression. C3-4 shows loss of disc height with focal central disc protrusion resulting in effacement of thecal sac with no significant compression.  There is mild facet hypertrophy bilaterally with mild right-sided foraminal narrowing but no definite compression. C4-5 shows broad-based central disc protrusion resulting in obliteration of thecal sac ventrally with no significant compression. C5-6 shows prominent disc osteophyte protrusion centrally and to the left along with facet hypertrophy resulting in mild left-sided foraminal narrowing. C6-7 also shows mild disc osteophyte protrusion and facet hypertrophy but no significant compression. Spinal cord parenchyma shows no abnormal signal intensities.  Paraspinal soft tissue appear unremarkable stable.  Visualized portion  of the lower brainstem and craniovertebral junction appear unremarkable.   MRI scan of cervical spine without contrast showing mild disc degenerative changes from C3-C6 with without significant compression. INTERPRETING PHYSICIAN: Delia Heady, MD Certified in  Neuroimaging by American Society of Neuroimaging and [place] [employer]ities  CT Head Wo Contrast  Result Date: 12/21/2022 CLINICAL DATA:  Headache and right-sided facial tingling. EXAM: CT HEAD WITHOUT CONTRAST TECHNIQUE: Contiguous axial images were obtained from the base of the skull through the vertex without intravenous contrast. RADIATION DOSE REDUCTION: This exam was performed according to the departmental dose-optimization program which includes automated exposure control, adjustment of the mA and/or kV according to patient size and/or use of iterative reconstruction technique. COMPARISON:  None Available. FINDINGS: Brain: No evidence of acute infarction, hemorrhage, hydrocephalus, extra-axial collection or mass lesion/mass effect. Vascular: No hyperdense vessel or unexpected calcification. Skull: Normal. Negative for fracture or focal lesion. Sinuses/Orbits: No acute finding.  The right lens is not identified. Other: None. IMPRESSION: 1. No acute intracranial pathology. Electronically Signed   By: Aram Candela M.D.   On: 12/21/2022 23:03   DG Chest 2 View  Result Date: 12/21/2022 CLINICAL DATA:  Headache with right-sided facial tingling. EXAM: CHEST - 2 VIEW COMPARISON:  September 16, 2022 FINDINGS: The heart size and mediastinal contours are within normal limits. Both lungs are clear. The visualized skeletal structures are unremarkable. IMPRESSION: No active cardiopulmonary disease. Electronically Signed   By: Aram Candela M.D.   On: 12/21/2022 19:14     Reassessment and Plan:    Patient declined MRI head and declined further work-up. Instructed to come back should she would like to return for further  evaluation and management and would like to follow up with her Neurologist.   Clinical Impression:  1. Headache disorder      Discharge   Final Clinical Impression(s) / ED Diagnoses Final diagnoses:  Headache disorder    Rx / DC Orders ED Discharge Orders     None         Hassan Rowan, [place], MD 01/14/23 Verneda Skill, MD 01/19/23 1256

## 2023-01-14 NOTE — Discharge Instructions (Signed)
You were seen today for a headache. Your evaluation did not reveal any immediate concerns tonight.  However given your history and ongoing symptoms over a month, I recommended an MRI be performed to further evaluate your brain for tumor or any other cause of your symptoms.  You have stated that she would like to be discharged at this time to follow-up with your neurologist regarding this condition.  Given the chronic nature of your symptoms this is reasonable but please feel free to return for further workup and management at any time should you wish.

## 2023-01-14 NOTE — Telephone Encounter (Signed)
Pt said all right side; face,ear, neck hurting,  arm has big bruise, tingling in fingers, feeling pressure back of head.  Pt would like a call from the nurse  Informed pt if having stroke like symptoms we advise you to go th the emergency room

## 2023-01-14 NOTE — ED Provider Triage Note (Signed)
Emergency Medicine Provider Triage Evaluation Note  SKI POLICH , a 67 y.o. female  was evaluated in triage.  Pt complains of headache for 2 months. She reports that she woke up in the morning with a right sided headache that goes down the neck. No slurred speech or vision changes. Reports improvement with gabapentin and tylenol. Denies recent falls, no blood thinner use.  Review of Systems  Positive: Generalized weakness, headache Negative: Photophobia, phonophobia  Physical Exam  BP (!) 158/71 (BP Location: Left Arm)   Pulse 84   Temp 98 F (36.7 C) (Oral)   Resp 18   Ht 5\' 4"  (1.626 m)   Wt 63.5 kg   SpO2 100%   BMI 24.03 kg/m  Gen:   Awake, no distress   Resp:  Normal effort  MSK:   Moves extremities without difficulty  Other:  Normal gait  Medical Decision Making  Medically screening exam initiated at 3:21 PM.  Appropriate orders placed.  ALEDA MADL was informed that the remainder of the evaluation will be completed by another provider, this initial triage assessment does not replace that evaluation, and the importance of remaining in the ED until their evaluation is complete.   Maxwell Marion, PA-C 01/14/23 1529

## 2023-01-15 ENCOUNTER — Telehealth: Payer: Self-pay | Admitting: Neurology

## 2023-01-15 ENCOUNTER — Other Ambulatory Visit: Payer: Medicare Other

## 2023-01-15 NOTE — Telephone Encounter (Signed)
Pt called wanting to know if the provider can order an MRI of the head due to her migraines getting worse. Please advise.

## 2023-01-18 ENCOUNTER — Encounter (INDEPENDENT_AMBULATORY_CARE_PROVIDER_SITE_OTHER): Payer: Medicare Other | Admitting: Neurology

## 2023-01-18 ENCOUNTER — Telehealth: Payer: Self-pay | Admitting: Neurology

## 2023-01-18 ENCOUNTER — Other Ambulatory Visit: Payer: Self-pay | Admitting: Family Medicine

## 2023-01-18 DIAGNOSIS — M542 Cervicalgia: Secondary | ICD-10-CM | POA: Diagnosis not present

## 2023-01-18 DIAGNOSIS — G43709 Chronic migraine without aura, not intractable, without status migrainosus: Secondary | ICD-10-CM

## 2023-01-18 DIAGNOSIS — R519 Headache, unspecified: Secondary | ICD-10-CM

## 2023-01-18 MED ORDER — NORTRIPTYLINE HCL 25 MG PO CAPS: 25.0000 mg | ORAL_CAPSULE | Freq: Every day | ORAL | 5 refills | Status: DC

## 2023-01-18 NOTE — Telephone Encounter (Signed)
Returned call to pt and asked frequency and regimen of meds that help w/pain:  25/30 days of migraines. Pt stated that she takes gabapentin 300 TID and tylenol extra strength 500mg  2 tabs after taking gabapentin each time. Pt stated that it seems to help some but then goes in ear, throat, and back of neck. Pt stated that she just wanted relief. Pt stated that she would like brain mri, thinks it will help diagnoses the problem. I see ct head completed ordered by schutt.

## 2023-01-18 NOTE — Telephone Encounter (Signed)
Meds ordered this encounter  Medications   nortriptyline (PAMELOR) 25 MG capsule    Sig: Take 1 capsule (25 mg total) by mouth at bedtime.    Dispense:  60 capsule    Refill:  5

## 2023-01-18 NOTE — Telephone Encounter (Signed)
Pt called and LVM wanting an RN to call her back to discuss several questions she has about her medications. Please advise.

## 2023-01-19 NOTE — Telephone Encounter (Signed)
Dr. Terrace Arabia sent mychart message, patient had no further questions

## 2023-01-20 ENCOUNTER — Other Ambulatory Visit: Payer: Self-pay

## 2023-01-20 ENCOUNTER — Ambulatory Visit: Payer: Medicare Other | Attending: Neurology | Admitting: Physical Therapy

## 2023-01-20 DIAGNOSIS — M5412 Radiculopathy, cervical region: Secondary | ICD-10-CM | POA: Insufficient documentation

## 2023-01-20 DIAGNOSIS — M62838 Other muscle spasm: Secondary | ICD-10-CM | POA: Diagnosis not present

## 2023-01-20 DIAGNOSIS — M542 Cervicalgia: Secondary | ICD-10-CM | POA: Insufficient documentation

## 2023-01-20 DIAGNOSIS — M25511 Pain in right shoulder: Secondary | ICD-10-CM | POA: Insufficient documentation

## 2023-01-20 NOTE — Therapy (Addendum)
OUTPATIENT PHYSICAL THERAPY CERVICAL EVALUATION   Patient Name: Dominique Vazquez MRN: [id number] DOB:[birth year], 67 y.o., female Today's Date: 01/20/2023  END OF SESSION:  PT End of Session - 01/20/23 1106     Visit Number 1    Number of Visits 7   with eval   Date for PT Re-Evaluation 03/17/23   to allow for scheduling delays   Authorization Type [employer] MEDICARE    PT Start Time 1106    PT Stop Time 1145    PT Time Calculation (min) 39 min    Activity Tolerance Patient tolerated treatment well    Behavior During Therapy WFL for tasks assessed/performed             Past Medical History:  Diagnosis Date   Chest pain    Closed fracture of distal end of right radius    Estrogen deficiency    Punctate keratitis    Past Surgical History:  Procedure Laterality Date   CESAREAN SECTION     Patient Active Problem List   Diagnosis Date Noted   Neck pain on right side 12/23/2022   Chronic migraine w/o aura w/o status migrainosus, not intractable 12/23/2022   POSTMENOPAUSAL ATROPHIC VAGINITIS 08/02/2007   MIGRAINE HEADACHE 06/16/2007   HAIR LOSS 06/16/2007    PCP: Deatra James, MD   REFERRING PROVIDER: Levert Feinstein, MD  REFERRING DIAG: M54.2 (ICD-10-CM) - Neck pain on right side  THERAPY DIAG:  Cervicalgia  Right shoulder pain, unspecified chronicity  Other muscle spasm  Radiculopathy, cervical region  Rationale for Evaluation and Treatment: Rehabilitation  ONSET DATE: 01/11/2023  SUBJECTIVE:                                                                                                                                                                                                         SUBJECTIVE STATEMENT: Patient comes to PT not understanding how therapy is going to help with her neck pain, is concerned that she may have had a "mild stroke". Patient reports right sided neck pain that can spread up into her R ear, R face behind her eye, and down into  her R shoulder, beneath her R collar bone and breast, with numbness going into the R arm. Pain improves with self massage, medication, and sleep. Reports headaches are not present until 30-60 min after waking, is concerned that they may be related to blood pressure or her vision, which is changing 2/2 glaucoma (has appointment on the 15th to address).  Patient reports history of a fall down a flight of stairs  2.5 years ago fx R ulna and hitting head- no surgeries and no concerns at that time. Reports recent R sided sciatica that has resolved. Presents with bruise on right arm that she does not know how it happened, originally saw 4-5 days ago and now it is "better than it was".  Has a granddaughter, Carollee Herter, with trisomy 66 3yo, and grandson [place], [place]. Becomes tearful talking about her grandchildren. Active listening provided.  Hand dominance: Right  PERTINENT HISTORY:      Migraine, R ulna fx ~2.5 yrs ago not surgically repaired  PAIN:  Are you having pain? Yes: NPRS scale: 5/10 Pain location: R neck, R face, tingling R arm into fingers, below R collar bone, R shoulder Pain description: dull Aggravating factors: motion, unsure Relieving factors: medication, sleep, massage feels better "Took some medicine before I came in here today"  PRECAUTIONS: None  RED FLAGS: Bowel or bladder incontinence: No     WEIGHT BEARING RESTRICTIONS: No  FALLS:  Has patient fallen in last 6 months? 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  DIAGNOSTIC FINDINGS:  EXAM: MRI cervical spine without contrast 01/07/2023  IMPRESSION: MRI scan of cervical spine without contrast showing mild disc degenerative changes from C3-C6 with without significant compression  PATIENT SURVEYS:  NDI 12/50, mild disability  COGNITION: Overall cognitive status: Within functional limits for tasks assessed  SENSATION: Light touch: Impaired Feels less on R side, rates tingling 2/10   POSTURE: rounded shoulders and forward head  CERVICAL ROM:   Active ROM A/PROM (deg) eval  Flexion 18* tingling with initial movement  Extension 45* Pain across base of back of neck  Right lateral flexion 35*  Left lateral flexion 34*  Right rotation 70*  Left rotation 90*   (Blank rows = not tested)  UPPER EXTREMITY ROM:  Active ROM Right eval Left eval  Shoulder flexion Full, pain free Full, pain free  Shoulder extension    Shoulder abduction Full, pain free Full, pain free  Shoulder adduction    Shoulder extension    Shoulder internal rotation    Shoulder external rotation    Elbow flexion [facility] WFL  Elbow extension [facility]  Wrist flexion    Wrist extension    Wrist ulnar deviation    Wrist radial deviation    Wrist pronation    Wrist supination     (Blank rows = not tested)  UPPER EXTREMITY MMT:  MMT Right eval Left eval  Shoulder flexion 5 5  Shoulder extension    Shoulder abduction 5 5  Shoulder adduction    Shoulder extension    Shoulder internal rotation    Shoulder external rotation    Middle trapezius    Lower trapezius    Elbow flexion 5 5  Elbow extension 5 5  Wrist flexion    Wrist extension    Wrist ulnar deviation    Wrist radial deviation    Wrist pronation    Wrist  supination    Grip strength WFL WFL   (Blank rows = not tested)  CERVICAL SPECIAL TESTS:  Median Nerve: Negative Ulnar Nerve: Negative,  strong stretching sensation Radial Nerve: Negative   TODAY'S TREATMENT:                                                                                                                              Eval Only  PATIENT EDUCATION:  Education details: POC, exam findings Person educated: Patient Education method: Explanation Education comprehension: verbalized understanding and needs further education  HOME EXERCISE PROGRAM: To be initiated at a future session  ASSESSMENT:  CLINICAL IMPRESSION:  Patient is a 67 year old female referred to Neuro OPPT for neck pain on the right side. Pt's PMH is significant for: migraine, R ulnar fx. The following deficits were present during the exam: pain in neck, R shoulder, R side of face, tingling into RUE, headaches, decreased cervical ROM, and anxiety. Pt would benefit from skilled PT to address these impairments and functional limitations to maximize functional mobility independence   OBJECTIVE IMPAIRMENTS: decreased mobility, decreased ROM, hypomobility, increased muscle spasms, impaired flexibility, impaired sensation, and pain.   ACTIVITY LIMITATIONS: sleeping  PARTICIPATION LIMITATIONS: community activity and occupation  PERSONAL FACTORS: Age, Fitness, and 1 comorbidity: migraine  are also affecting patient's functional outcome.   REHAB POTENTIAL: Good  CLINICAL DECISION MAKING: Stable/uncomplicated  EVALUATION COMPLEXITY: Low   GOALS: Goals reviewed with patient? Yes  SHORT TERM GOALS: Target date: 02/10/2023   Pt will be independent with initial HEP for improved cervical ROM, improved posture and management of pain symptoms in order to build upon functional gains made in therapy. Baseline:  Goal status: INITIAL   LONG TERM GOALS: Target date: 03/03/2023  Pt will be independent with final HEP for improved cervical ROM, improved posture and management of pain symptoms in order to build upon functional gains made in  therapy. Baseline:  Goal status: INITIAL  2.  Pt will improve NDI to 7/50 (MCID 5) to demonstrate decreased neck pain and increased function with daily activities and participation. Baseline: 12/50 (01/20/23) Goal status: INITIAL  3.   Pt will improve neck flexion ROM to at least 40* to demonstrate improved functional mobility.  Baseline: 18* (01/20/23) Goal status: INITIAL  4.  Pt will improve neck extension ROM to at least 50* to demonstrate improved functional mobility. Baseline: 45* (01/20/23) Goal status: INITIAL   PLAN:  PT FREQUENCY: 1x/week  PT DURATION: 6 weeks  PLANNED INTERVENTIONS: Therapeutic exercises, Therapeutic activity, Neuromuscular re-education, Balance training, Gait training, Patient/Family education, Self Care, Joint mobilization, Dry Needling, Electrical stimulation, Spinal mobilization, Cryotherapy, Moist heat, Taping, Traction, Manual therapy, and Re-evaluation  PLAN FOR NEXT SESSION: Further assess RUE- ER, IR, middle and lower trap strength, initial HEP- stretches for neck and pecs, dry needling?, chin retractions, wall slides, postural education, scapular retractions, chirp wheel/tennis balls   Beverely Low, Student-PT 01/20/2023, 12:42 PM

## 2023-01-20 NOTE — Addendum Note (Signed)
Addended by: Peter Congo on: 01/20/2023 01:46 PM   Modules accepted: Orders

## 2023-01-26 ENCOUNTER — Institutional Professional Consult (permissible substitution): Payer: Medicare Other | Admitting: Plastic Surgery

## 2023-01-26 ENCOUNTER — Telehealth: Payer: Self-pay | Admitting: Neurology

## 2023-01-26 DIAGNOSIS — H401232 Low-tension glaucoma, bilateral, moderate stage: Secondary | ICD-10-CM | POA: Diagnosis not present

## 2023-01-26 DIAGNOSIS — H401212 Low-tension glaucoma, right eye, moderate stage: Secondary | ICD-10-CM | POA: Diagnosis not present

## 2023-01-26 NOTE — Telephone Encounter (Signed)
Pt said, started having pain  on right side in back of ear then goes back of head, then goes to right shoulder. Would like a call back.

## 2023-01-26 NOTE — Telephone Encounter (Signed)
I called patient, reviewed chart, recent MRI of the brain CT head was normal, pending appointment with Amy in April 2025    It is okay for her to take higher dose of gabapentin 300 mg up to 6 tablets as needed, also may combine it with Mobic as needed, warm compression, neck stretching exercise   Please see the MyChart message reply(ies) for my assessment and plan.    This patient gave consent for this Medical Advice Message and is aware that it may result in a bill to [employer], as well as the possibility of receiving a bill for a co-payment or deductible. They are an established patient, but are not seeking medical advice exclusively about a problem treated during an in person or video visit in the last seven days. I did not recommend an in person or video visit within seven days of my reply.    I spent a total of 6 minutes cumulative time within 7 days through [employer].  Levert Feinstein, MD

## 2023-01-26 NOTE — Telephone Encounter (Signed)
Call back to patient, she reports a feeling of water in her ears with pain that travels down to her neck and then makes her feels dizzy, she reports it feeling like he skin is being pulled off and some tingling and numbness in her right leg and arm. She reprots that these are not new symptoms but the same that she has discussed with Dr Terrace Arabia. She also reports this happens when she has migraines but does not have a migraine at this time.She also reports swelling in her right eye and going to Dr. Bascom Levels today to have some glaucoma removed. Advised I would send to Dr, Terrace Arabia to review and our office would call back. Patient appreciative of call.

## 2023-01-26 NOTE — Telephone Encounter (Signed)
Chart reviewed, recent MRI of the brain CT head was normal, pending appointment with Amy in April 2025  It is okay for her to take higher dose of gabapentin 300 mg up to 6 tablets as needed, hope this will help her mentioned symptoms

## 2023-01-29 ENCOUNTER — Ambulatory Visit: Payer: Medicare Other | Admitting: Physical Therapy

## 2023-01-29 DIAGNOSIS — M5412 Radiculopathy, cervical region: Secondary | ICD-10-CM | POA: Diagnosis not present

## 2023-01-29 DIAGNOSIS — M25511 Pain in right shoulder: Secondary | ICD-10-CM | POA: Diagnosis not present

## 2023-01-29 DIAGNOSIS — M62838 Other muscle spasm: Secondary | ICD-10-CM | POA: Diagnosis not present

## 2023-01-29 DIAGNOSIS — M542 Cervicalgia: Secondary | ICD-10-CM

## 2023-01-29 NOTE — Therapy (Signed)
OUTPATIENT PHYSICAL THERAPY CERVICAL TREATMENT   Patient Name: Dominique Vazquez MRN: [id number] DOB:Jun 29, 1955, 67 y.o., female Today's Date: 01/29/2023  END OF SESSION:  PT End of Session - 01/29/23 1446     Visit Number 2    Number of Visits 7   with eval   Date for PT Re-Evaluation 03/17/23   to allow for scheduling delays   Authorization Type [employer]    PT Start Time 1445    PT Stop Time 1525    PT Time Calculation (min) 40 min    Activity Tolerance Patient tolerated treatment well    Behavior During Therapy [facility] for tasks assessed/performed              Past Medical History:  Diagnosis Date   Chest pain    Closed fracture of distal end of right radius    Estrogen deficiency    Punctate keratitis    Past Surgical History:  Procedure Laterality Date   CESAREAN SECTION     Patient Active Problem List   Diagnosis Date Noted   Neck pain on right side 12/23/2022   Chronic migraine w/o aura w/o status migrainosus, not intractable 12/23/2022   POSTMENOPAUSAL ATROPHIC VAGINITIS 08/02/2007   MIGRAINE HEADACHE 06/16/2007   HAIR LOSS 06/16/2007    PCP: Deatra James, MD   REFERRING PROVIDER: Levert Feinstein, MD  REFERRING DIAG: M54.2 (ICD-10-CM) - Neck pain on right side  THERAPY DIAG:  Cervicalgia  Right shoulder pain, unspecified chronicity  Other muscle spasm  Radiculopathy, cervical region  Rationale for Evaluation and Treatment: Rehabilitation  ONSET DATE: 01/11/2023  SUBJECTIVE:                                                                                                                                                                                                         SUBJECTIVE STATEMENT: Pt reports her R neck pain and tingling down into R hand has improved since initial eval, does still have it intermittently. Pt doubled her dose of gabapentin per doctor's orders and that has also improved her symptoms. Pain 2/10 today, improved  from initial eval.  Hand dominance: Right  PERTINENT HISTORY:      Migraine, R ulna fx ~2.5 yrs ago not surgically repaired  PAIN:  Are you having pain? Yes: NPRS scale: 2/10 Pain location: R neck, R face, tingling R arm into fingers, below R collar bone, R shoulder Pain description: dull Aggravating factors: motion, unsure Relieving factors: medication, sleep, massage feels better "Took some medicine before I came in here today"  PRECAUTIONS: None  RED FLAGS: Bowel or bladder incontinence: No     WEIGHT BEARING RESTRICTIONS: No  FALLS:  Has patient fallen in last 6 months? 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  DIAGNOSTIC FINDINGS:  EXAM: MRI cervical spine without contrast 01/07/2023  IMPRESSION: MRI scan of cervical spine without contrast showing mild disc degenerative changes from C3-C6 with without significant compression  PATIENT SURVEYS:  NDI 12/50, mild disability  COGNITION: Overall cognitive status: Within functional limits for tasks assessed  SENSATION: Light touch: Impaired Feels less on R side, rates tingling 2/10   POSTURE: rounded shoulders and forward head  CERVICAL ROM:   Active ROM A/PROM (deg) eval  Flexion 18* tingling with initial movement  Extension 45* Pain across base of back of neck  Right lateral flexion 35*  Left lateral flexion 34*  Right rotation 70*  Left rotation  90*   (Blank rows = not tested)  UPPER EXTREMITY ROM:  Active ROM Right eval Left eval  Shoulder flexion Full, pain free Full, pain free  Shoulder extension    Shoulder abduction Full, pain free Full, pain free  Shoulder adduction    Shoulder extension    Shoulder internal rotation    Shoulder external rotation    Elbow flexion [facility] WFL  Elbow extension [facility]  Wrist flexion    Wrist extension    Wrist ulnar deviation    Wrist radial deviation    Wrist pronation    Wrist supination     (Blank rows = not tested)  UPPER EXTREMITY MMT:  MMT Right eval Left eval  Shoulder flexion 5 5  Shoulder extension    Shoulder abduction 5 5  Shoulder adduction    Shoulder extension    Shoulder internal rotation    Shoulder external rotation    Middle trapezius    Lower trapezius    Elbow flexion 5 5  Elbow extension 5 5  Wrist flexion    Wrist extension    Wrist ulnar deviation    Wrist radial deviation    Wrist pronation    Wrist supination    Grip strength WFL WFL   (Blank rows = not tested)  CERVICAL SPECIAL TESTS:  Median Nerve: Negative Ulnar Nerve: Negative, strong stretching sensation Radial Nerve: Negative   TODAY'S TREATMENT:  TherEx Seated B UT stretch 3 x 30 sec each B Seated R SCM stretch 3 x 30 sec each Seated levator scap stretch 3 x 30 sec each B  Manual Therapy Cervical traction 4 x 30 sec each, no change in pain or symptoms though patient not having nerve pain down into her RUE currently  TherAct Trigger Point Dry-Needling  Treatment instructions: Expect mild to moderate muscle soreness. S/S of pneumothorax if dry needled over a lung field, and to seek immediate medical attention should they occur. Patient verbalized understanding of these instructions and education.  Patient Consent Given: Yes Education handout provided:  Yes Muscles treated: L and R upper trap, R SCM Treatment response/outcome: deep ache/pressure; muscle twitch detected   Trigger Point Dry Needling  What is Trigger Point Dry Needling (DN)? DN is a physical therapy technique used to treat muscle pain and dysfunction. Specifically, DN helps deactivate muscle trigger points (muscle knots).  A thin filiform needle is used to penetrate the skin and stimulate the underlying trigger point. The goal is for a local twitch response (LTR) to occur and for the trigger point to relax. No medication of any kind is injected during the procedure.   What Does Trigger Point Dry Needling Feel Like?  The procedure feels different for each individual patient. Some patients report that they do not actually feel the needle enter the skin and overall the process is not painful. Very mild bleeding may occur. However, many patients feel a deep cramping in the muscle in which the needle was inserted. This is the local twitch response.   How Will I feel after the treatment? Soreness is normal, and the onset of soreness may not occur for a few hours. Typically this soreness does not last longer than two days.  Bruising is uncommon, however; ice can be used to decrease any possible bruising.  In rare cases feeling tired or nauseous after the treatment is normal. In addition, your symptoms may get worse before they get better, this period will typically not last longer than 24 hours.   What Can I do After My Treatment? Increase your hydration by drinking more water for the next 24 hours. You may place ice or heat on the areas treated that have become sore, however, do not use heat on inflamed or bruised areas. Heat often brings more relief post needling. You can continue your regular activities, but vigorous activity is not recommended initially after the treatment for 24 hours. DN is best combined with other physical therapy such as strengthening, stretching, and other  therapies.    PATIENT EDUCATION:  Education details: initiated HEP, TPDN (see above) Person educated: Patient Education method: Explanation, Demonstration, Verbal cues, and Handouts Education comprehension: verbalized understanding, returned demonstration, and needs further education  HOME EXERCISE PROGRAM: Access Code: [email or web address] URL: [email or web address] Date: 01/29/2023 Prepared by: Peter Congo  Exercises - Seated Upper Trapezius Stretch  - 1 x daily - 7 x weekly - 1 sets - 3-5 reps - 30-60 sec hold - Gentle Levator Scapulae Stretch  - 1 x daily - 7 x weekly - 1 sets - 3-5 reps - 30-60 sec hold - Sternocleidomastoid Stretch  - 1 x daily - 7 x weekly - 1 sets - 3-5 reps - 30-60 sec hold  ASSESSMENT:  CLINICAL IMPRESSION:  Emphasis of skilled PT session on initiating TPDN and stretching HEP to address muscle pain and tightness. Pt with good response to DN this date, can better assess response next session. Pt  also feels relief of symptoms following stretches. Pt with minimal nerve symptoms this date and presents with overall improvement from her initial assessment. Pt continues to benefit from skilled therapy services to work towards increased independence with management of pain symptoms and for postural retraining. Continue POC.    OBJECTIVE IMPAIRMENTS: decreased mobility, decreased ROM, hypomobility, increased muscle spasms, impaired flexibility, impaired sensation, and pain.   ACTIVITY LIMITATIONS: sleeping  PARTICIPATION LIMITATIONS: community activity and occupation  PERSONAL FACTORS: Age, Fitness, and 1 comorbidity: migraine  are also affecting patient's functional outcome.   REHAB POTENTIAL: Good  CLINICAL DECISION MAKING: Stable/uncomplicated  EVALUATION COMPLEXITY: Low   GOALS: Goals reviewed with patient? Yes  SHORT TERM GOALS: Target date: 02/10/2023   Pt will be independent with initial HEP for improved cervical ROM, improved posture and  management of pain symptoms in order to build upon functional gains made in therapy. Baseline:  Goal status: INITIAL   LONG TERM GOALS: Target date: 03/03/2023  Pt will be independent with final HEP for improved cervical ROM, improved posture and management of pain symptoms in order to build upon functional gains made in therapy. Baseline:  Goal status: INITIAL  2.  Pt will improve NDI to 7/50 (MCID 5) to demonstrate decreased neck pain and increased function with daily activities and participation. Baseline: 12/50 (01/20/23) Goal status: INITIAL  3.   Pt will improve neck flexion ROM to at least 40* to demonstrate improved functional mobility.  Baseline: 18* (01/20/23) Goal status: INITIAL  4.  Pt will improve neck extension ROM to at least 50* to demonstrate improved functional mobility. Baseline: 45* (01/20/23) Goal status: INITIAL   PLAN:  PT FREQUENCY: 1x/week  PT DURATION: 6 weeks  PLANNED INTERVENTIONS: Therapeutic exercises, Therapeutic activity, Neuromuscular re-education, Balance training, Gait training, Patient/Family education, Self Care, Joint mobilization, Dry Needling, Electrical stimulation, Spinal mobilization, Cryotherapy, Moist heat, Taping, Traction, Manual therapy, and Re-evaluation  PLAN FOR NEXT SESSION: Further assess RUE- ER, IR, middle and lower trap strength, how was initial HEP? add to HEP- stretches for neck and pecs, how was response to dry needling?, chin retractions, wall slides, postural education, scapular retractions, chirp wheel/tennis balls   Peter Congo, PT Peter Congo, PT, DPT, CSRS  01/29/2023, 3:29 PM

## 2023-02-01 ENCOUNTER — Ambulatory Visit: Payer: Medicare Other | Admitting: Physical Therapy

## 2023-02-01 DIAGNOSIS — M5412 Radiculopathy, cervical region: Secondary | ICD-10-CM

## 2023-02-01 DIAGNOSIS — M62838 Other muscle spasm: Secondary | ICD-10-CM

## 2023-02-01 DIAGNOSIS — M25511 Pain in right shoulder: Secondary | ICD-10-CM | POA: Diagnosis not present

## 2023-02-01 DIAGNOSIS — M542 Cervicalgia: Secondary | ICD-10-CM | POA: Diagnosis not present

## 2023-02-01 NOTE — Therapy (Signed)
OUTPATIENT PHYSICAL THERAPY CERVICAL TREATMENT   Patient Name: Dominique Vazquez MRN: [id number] DOB:[birth year], 67 y.o., female Today's Date: 02/01/2023  END OF SESSION:  PT End of Session - 02/01/23 1611     Visit Number 3    Number of Visits 7   with eval   Date for PT Re-Evaluation 03/17/23   to allow for scheduling delays   Authorization Type [employer]    PT Start Time 1610    PT Stop Time 1652    PT Time Calculation (min) 42 min    Activity Tolerance Patient tolerated treatment well    Behavior During Therapy [facility] for tasks assessed/performed               Past Medical History:  Diagnosis Date   Chest pain    Closed fracture of distal end of right radius    Estrogen deficiency    Punctate keratitis    Past Surgical History:  Procedure Laterality Date   CESAREAN SECTION     Patient Active Problem List   Diagnosis Date Noted   Neck pain on right side 12/23/2022   Chronic migraine w/o aura w/o status migrainosus, not intractable 12/23/2022   POSTMENOPAUSAL ATROPHIC VAGINITIS 08/02/2007   MIGRAINE HEADACHE 06/16/2007   HAIR LOSS 06/16/2007    PCP: Deatra James, MD   REFERRING PROVIDER: Levert Feinstein, MD  REFERRING DIAG: M54.2 (ICD-10-CM) - Neck pain on right side  THERAPY DIAG:  Cervicalgia  Right shoulder pain, unspecified chronicity  Other muscle spasm  Radiculopathy, cervical region  Rationale for Evaluation and Treatment: Rehabilitation  ONSET DATE: 01/11/2023  SUBJECTIVE:                                                                                                                                                                                                         SUBJECTIVE STATEMENT: Pt reports she worked out this morning (alternated walking/running on indoor track at [employer] x hour and then lifted some weights 20-25#). Pt reports ongoing numbness/tingling into her RUE as well as up into her neck, painful spot in back of R  shoulder blade that goes through to her chest.  Pt did not notice a huge difference with DN last session, did have some relief with HEP stretches but pain does pop back up.  Hand dominance: Right  PERTINENT HISTORY:      Migraine, R ulna fx ~2.5 yrs ago not surgically repaired  PAIN:  Are you having pain? Yes: NPRS scale: 2/10 Pain location: R neck, R face, tingling R arm into  fingers, below R collar bone, R shoulder Pain description: dull Aggravating factors: motion, unsure Relieving factors: medication, sleep, massage feels better "Took some medicine before I came in here today"  PRECAUTIONS: None  RED FLAGS: Bowel or bladder incontinence: No     WEIGHT BEARING RESTRICTIONS: No  FALLS:  Has patient fallen in last 6 months? 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  DIAGNOSTIC FINDINGS:  EXAM: MRI cervical spine without contrast 01/07/2023  IMPRESSION: MRI scan of cervical spine without contrast showing mild disc degenerative changes from C3-C6 with without significant compression  PATIENT SURVEYS:  NDI 12/50, mild disability  COGNITION: Overall cognitive status: Within functional limits for tasks assessed  SENSATION: Light touch: Impaired Feels less on R side, rates tingling 2/10   POSTURE: rounded shoulders and forward head  CERVICAL ROM:   Active ROM A/PROM (deg) eval  Flexion 18* tingling with  initial movement  Extension 45* Pain across base of back of neck  Right lateral flexion 35*  Left lateral flexion 34*  Right rotation 70*  Left rotation 90*   (Blank rows = not tested)  UPPER EXTREMITY ROM:  Active ROM Right eval Left eval  Shoulder flexion Full, pain free Full, pain free  Shoulder extension    Shoulder abduction Full, pain free Full, pain free  Shoulder adduction    Shoulder extension    Shoulder internal rotation    Shoulder external rotation    Elbow flexion [facility] WFL  Elbow extension [facility]  Wrist flexion    Wrist extension    Wrist ulnar deviation    Wrist radial deviation    Wrist pronation    Wrist supination     (Blank rows = not tested)  UPPER EXTREMITY MMT:  MMT Right eval Left eval  Shoulder flexion 5 5  Shoulder extension    Shoulder abduction 5 5  Shoulder adduction    Shoulder extension    Shoulder internal rotation    Shoulder external rotation    Middle trapezius    Lower trapezius    Elbow flexion 5 5  Elbow extension 5 5  Wrist flexion    Wrist extension    Wrist ulnar deviation    Wrist radial deviation    Wrist pronation    Wrist supination    Grip strength WFL WFL   (Blank rows = not tested)  CERVICAL SPECIAL TESTS:  Median Nerve: Negative Ulnar Nerve: Negative, strong stretching sensation Radial Nerve: Negative   TODAY'S TREATMENT:  TherEx Supine thoracic mobilization over half foam roll: Shoulder abduction x 15 reps Shoulder flexion x 10 reps  Supine cervical retraction x 10 reps  Standing wall angels, tightness in L lower arm then in R lower arm as reps progress  Seated anterior leans on blue Swiss ball x 10 reps +lateral lean to the L/R x 5 reps   Added appropriate exercises to HEP, see bolded below   TherAct Trigger Point Dry-Needling  Treatment instructions: Expect mild  to moderate muscle soreness. S/S of pneumothorax if dry needled over a lung field, and to seek immediate medical attention should they occur. Patient verbalized understanding of these instructions and education.  Patient Consent Given: Yes Education handout provided: Yes Muscles treated: R rhomboids Treatment response/outcome: deep ache/pressure; muscle twitch detected; "relief"    PATIENT EDUCATION:  Education details: added to HEP, continue HEP Person educated: Patient Education method: Explanation, Demonstration, Verbal cues, and Handouts Education comprehension: verbalized understanding, returned demonstration, and needs further education  HOME EXERCISE PROGRAM: Access Code: [email or web address] URL: [email or web address] Date: 01/29/2023 Prepared by: Peter Congo  Exercises - Seated Upper Trapezius Stretch  - 1 x daily - 7 x weekly - 1 sets - 3-5 reps - 30-60 sec hold - Gentle Levator Scapulae Stretch  - 1 x daily - 7 x weekly - 1 sets - 3-5 reps - 30-60 sec hold - Sternocleidomastoid Stretch  - 1 x daily - 7 x weekly - 1 sets - 3-5 reps - 30-60 sec hold  - Seated Thoracic Flexion and Rotation with Swiss Ball  - 1 x daily - 7 x weekly - 1 sets - 10 reps - 30 sec hold - Seated Flexion Stretch with Swiss Ball  - 1 x daily - 7 x weekly - 1 sets - 10 reps - 30 sec hold - Supine Chin Tuck  - 1 x daily - 7 x weekly - 3 sets - 10 reps  ASSESSMENT:  CLINICAL IMPRESSION:  Emphasis of skilled PT session on performing TPDN to R rhomboids followed by stretching and then strengthening of cervical muscles. Pt with good relief of tightness and pain following TPDN and stretching. Pt can continue to benefit from skilled therapy services to work towards increased independence with management of pain symptoms as well as postural retraining. Continue POC.    OBJECTIVE IMPAIRMENTS: decreased mobility, decreased ROM, hypomobility, increased muscle spasms, impaired flexibility, impaired  sensation, and pain.   ACTIVITY LIMITATIONS: sleeping  PARTICIPATION LIMITATIONS: community activity and occupation  PERSONAL FACTORS: Age, Fitness, and 1 comorbidity: migraine  are also affecting patient's functional outcome.   REHAB POTENTIAL: Good  CLINICAL DECISION MAKING: Stable/uncomplicated  EVALUATION COMPLEXITY: Low   GOALS: Goals reviewed with patient? Yes  SHORT TERM GOALS: Target date: 02/10/2023   Pt will be independent with initial HEP for improved cervical ROM, improved posture and management of pain symptoms in order to build upon functional gains made in therapy. Baseline:  Goal status: INITIAL   LONG TERM GOALS: Target date: 03/03/2023  Pt will be independent with final HEP for improved cervical ROM, improved posture and management of pain symptoms in order to build upon functional gains made in therapy. Baseline:  Goal status: INITIAL  2.  Pt will improve NDI to 7/50 (MCID 5) to demonstrate decreased neck pain and increased function with daily activities and participation. Baseline: 12/50 (01/20/23) Goal status: INITIAL  3.   Pt will improve neck flexion ROM to at least 40* to demonstrate improved functional mobility.  Baseline: 18* (  01/20/23) Goal status: INITIAL  4.  Pt will improve neck extension ROM to at least 50* to demonstrate improved functional mobility. Baseline: 45* (01/20/23) Goal status: INITIAL   PLAN:  PT FREQUENCY: 1x/week  PT DURATION: 6 weeks  PLANNED INTERVENTIONS: Therapeutic exercises, Therapeutic activity, Neuromuscular re-education, Balance training, Gait training, Patient/Family education, Self Care, Joint mobilization, Dry Needling, Electrical stimulation, Spinal mobilization, Cryotherapy, Moist heat, Taping, Traction, Manual therapy, and Re-evaluation  PLAN FOR NEXT SESSION: Further assess RUE- ER, IR, middle and lower trap strength, how is HEP? add to HEP- stretches for neck and pecs, how was response to dry needling?,  postural education, scapular retractions, chirp wheel/tennis balls, IYTs   Peter Congo, PT Peter Congo, PT, DPT, CSRS  02/01/2023, 4:52 PM

## 2023-02-09 DIAGNOSIS — H401122 Primary open-angle glaucoma, left eye, moderate stage: Secondary | ICD-10-CM | POA: Diagnosis not present

## 2023-02-12 ENCOUNTER — Ambulatory Visit: Payer: Medicare Other | Attending: Family Medicine | Admitting: Physical Therapy

## 2023-02-12 VITALS — BP 113/82 | HR 79

## 2023-02-12 DIAGNOSIS — M25511 Pain in right shoulder: Secondary | ICD-10-CM | POA: Diagnosis not present

## 2023-02-12 DIAGNOSIS — M542 Cervicalgia: Secondary | ICD-10-CM | POA: Insufficient documentation

## 2023-02-12 DIAGNOSIS — M5412 Radiculopathy, cervical region: Secondary | ICD-10-CM | POA: Insufficient documentation

## 2023-02-12 DIAGNOSIS — M62838 Other muscle spasm: Secondary | ICD-10-CM | POA: Insufficient documentation

## 2023-02-12 NOTE — Therapy (Addendum)
OUTPATIENT PHYSICAL THERAPY CERVICAL TREATMENT   Patient Name: Dominique Vazquez MRN: [id number] DOB:30-Apr-1955, 67 y.o., female Today's Date: 02/12/2023  END OF SESSION:  PT End of Session - 02/12/23 1009     Visit Number 4    Number of Visits 7   with eval   Date for PT Re-Evaluation 03/17/23   to allow for scheduling delays   Authorization Type [employer]    PT Start Time 1010    PT Stop Time 1101    PT Time Calculation (min) 51 min    Activity Tolerance Patient tolerated treatment well    Behavior During Therapy [facility] for tasks assessed/performed                Past Medical History:  Diagnosis Date   Chest pain    Closed fracture of distal end of right radius    Estrogen deficiency    Punctate keratitis    Past Surgical History:  Procedure Laterality Date   CESAREAN SECTION     Patient Active Problem List   Diagnosis Date Noted   Neck pain on right side 12/23/2022   Chronic migraine w/o aura w/o status migrainosus, not intractable 12/23/2022   POSTMENOPAUSAL ATROPHIC VAGINITIS 08/02/2007   MIGRAINE HEADACHE 06/16/2007   HAIR LOSS 06/16/2007    PCP: Deatra James, MD   REFERRING PROVIDER: Levert Feinstein, MD  REFERRING DIAG: M54.2 (ICD-10-CM) - Neck pain on right side  THERAPY DIAG:  Right shoulder pain, unspecified chronicity  Cervicalgia  Other muscle spasm  Radiculopathy, cervical region  Rationale for Evaluation and Treatment: Rehabilitation  ONSET DATE: 01/11/2023  SUBJECTIVE:                                                                                                                                                                                                         SUBJECTIVE STATEMENT:  Pt reports feeling well. Reports decreased tingling in arms and legs and decreased pain. DN was helpful last week.   Hand dominance: Right  PERTINENT HISTORY:      Migraine, R ulna fx ~2.5 yrs ago not surgically repaired  PAIN: Are  you having pain?  "It comes and goes"   PRECAUTIONS: None  RED FLAGS: Bowel or bladder incontinence: No     WEIGHT BEARING RESTRICTIONS: No  FALLS:  Has patient fallen in last 6 months? 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  DIAGNOSTIC FINDINGS:  EXAM: MRI cervical spine without contrast 01/07/2023  IMPRESSION: MRI scan of cervical spine without contrast showing mild disc degenerative changes from C3-C6 with without significant compression  PATIENT SURVEYS:  NDI 12/50, mild disability  COGNITION: Overall cognitive status: Within functional limits for tasks assessed  SENSATION: Light touch: Impaired Feels less on R side, rates tingling 2/10   POSTURE: rounded shoulders and forward head  CERVICAL ROM:   Active ROM A/PROM (deg) eval  Flexion 18* tingling with initial movement  Extension 45* Pain across base of back of neck  Right lateral flexion 35*  Left lateral flexion 34*  Right rotation 70*  Left rotation 90*   (Blank rows = not tested)  UPPER EXTREMITY ROM:  Active ROM Right eval Left eval  Shoulder flexion Full, pain free Full, pain free  Shoulder extension    Shoulder abduction Full, pain free Full, pain free  Shoulder adduction    Shoulder extension    Shoulder internal rotation    Shoulder external rotation    Elbow flexion [facility] WFL  Elbow extension [facility]  Wrist flexion    Wrist  extension    Wrist ulnar deviation    Wrist radial deviation    Wrist pronation    Wrist supination     (Blank rows = not tested)  UPPER EXTREMITY MMT:  MMT Right eval Left eval  Shoulder flexion 5 5  Shoulder extension    Shoulder abduction 5 5  Shoulder adduction    Shoulder extension    Shoulder internal rotation    Shoulder external rotation    Middle trapezius    Lower trapezius    Elbow flexion 5 5  Elbow extension 5 5  Wrist flexion    Wrist extension    Wrist ulnar deviation    Wrist radial deviation    Wrist pronation    Wrist supination    Grip strength WFL WFL   (Blank rows = not tested)  CERVICAL SPECIAL TESTS:  Median Nerve: Negative Ulnar Nerve: Negative, strong stretching sensation Radial Nerve: Negative   TODAY'S TREATMENT:        TherAct Trigger Point Dry-Needling  Treatment instructions: Expect mild to moderate muscle soreness. S/S of pneumothorax if dry needled over a lung field, and to seek immediate medical attention should they occur. Patient verbalized understanding of these instructions and education.  Patient Consent Given: Yes Education handout provided: Yes Muscles treated: R upper trapezius, R sternocleidomastoid Treatment response/outcome: deep ache/pressure; muscle twitch detected; "relief"  Vitals:   02/12/23 1017  BP: 113/82  Pulse: 79   L arm, seated  TherEx Bilateral Seated I, T, Ys with 2# dumbbells x10 repetitions each Pt reports feeling a good stretch in lats with Is Pt reports feeling Ts more in neck Explained to pt that she can progress to prone position with HEP as she gets stronger  Supine serratus punches with 2# dumbbells 2 sets x10 repetitions performed on each side Pt initially with difficulty elevating shoulder blade off table on R, practiced without weight until consistently could  perform  Supine bilateral lat stretch with 2# dumbbells pulling into maximal shoulder flexion, 2 repetitions x 60 second hold  Seated rows w/ green band resistance x10 repetitions  Wall slides x10 repetitions Add lift x10 repetitions   PATIENT EDUCATION:  Education details: HEP additions, continue HEP, POC Person educated: Patient Education method: Explanation, Demonstration, Verbal cues, and Handouts Education comprehension: verbalized understanding, returned demonstration, and needs further education  HOME EXERCISE PROGRAM: Access Code: [email or web address] URL: [email or web address] Date: 01/29/2023 Prepared by: Peter Congo  Exercises - Seated Upper Trapezius Stretch  - 1 x daily - 7 x weekly - 1 sets - 3-5 reps - 30-60 sec hold - Gentle Levator Scapulae Stretch  - 1 x daily - 7 x weekly - 1 sets - 3-5 reps - 30-60 sec hold - Sternocleidomastoid Stretch  - 1 x daily - 7 x weekly - 1 sets - 3-5 reps - 30-60 sec hold - Seated Thoracic Flexion and Rotation with Swiss Ball  - 1 x daily - 7 x weekly - 1 sets - 10 reps - 30 sec hold - Seated Flexion Stretch with Swiss Ball  - 1 x daily - 7 x weekly - 1 sets - 10 reps - 30 sec hold - Supine Chin Tuck  - 1 x daily - 7 x weekly - 3 sets - 10 reps - Seated Shoulder Flexion with Dumbbells  - 1 x daily - 7 x weekly - 3 sets - 10 reps - Seated Bilateral Shoulder Scaption with Dumbbells  - 1 x daily - 7 x weekly - 3 sets - 10 reps - Seated Shoulder Abduction - Thumbs Up  - 1 x daily - 7 x weekly - 3 sets - 10 reps - Supine Scapular Protraction in Flexion with Dumbbells  - 1 x daily - 7 x weekly - 3 sets - 10 reps - Supine Shoulder Flexion Extension Full Range AROM  - 1 x daily - 7 x weekly - 1 sets - 3 reps - 30-60 seconds hold - Seated Shoulder Row with Anchored Resistance  - 1 x daily - 7 x weekly - 3 sets - 10 reps - Scapular Wall Slides  - 1 x daily - 7 x weekly - 3 sets - 10 reps  ASSESSMENT:  CLINICAL IMPRESSION:  Emphasis of  skilled PT session on performing TPDN to R upper trapezius and sternocleidomastoid followed by stretching and strengthening of shoulder and scapular musculature. Pt has met 1/1 STGs assessed this date. Pt with good relief of tightness and pain following TPDN and stretching.  Continue POC.    OBJECTIVE IMPAIRMENTS: decreased mobility, decreased ROM, hypomobility, increased muscle spasms, impaired flexibility, impaired sensation, and pain.   ACTIVITY LIMITATIONS: sleeping  PARTICIPATION LIMITATIONS: community activity and occupation  PERSONAL FACTORS: Age, Fitness, and 1 comorbidity: migraine  are also affecting patient's functional outcome.   REHAB POTENTIAL: Good  CLINICAL DECISION MAKING: Stable/uncomplicated  EVALUATION COMPLEXITY: Low   GOALS: Goals reviewed with patient? Yes  SHORT TERM GOALS: Target date: 02/10/2023    Pt will  be independent with initial HEP for improved cervical ROM, improved posture and management of pain symptoms in order to build upon functional gains made in therapy. Baseline:  Goal status: MET   LONG TERM GOALS: Target date: 03/03/2023  Pt will be independent with final HEP for improved cervical ROM, improved posture and management of pain symptoms in order to build upon functional gains made in therapy. Baseline:  Goal status: INITIAL  2.  Pt will improve NDI to 7/50 (MCID 5) to demonstrate decreased neck pain and increased function with daily activities and participation. Baseline: 12/50 (01/20/23) Goal status: INITIAL  3.   Pt will improve neck flexion ROM to at least 40* to demonstrate improved functional mobility.  Baseline: 18* (01/20/23) Goal status: INITIAL  4.  Pt will improve neck extension ROM to at least 50* to demonstrate improved functional mobility. Baseline: 45* (01/20/23) Goal status: INITIAL   PLAN:  PT FREQUENCY: 1x/week  PT DURATION: 6 weeks  PLANNED INTERVENTIONS: Therapeutic exercises, Therapeutic activity,  Neuromuscular re-education, Balance training, Gait training, Patient/Family education, Self Care, Joint mobilization, Dry Needling, Electrical stimulation, Spinal mobilization, Cryotherapy, Moist heat, Taping, Traction, Manual therapy, and Re-evaluation  PLAN FOR NEXT SESSION: Further assess RUE- ER, IR, middle and lower trap strength, how is HEP? add to HEP- stretches for neck and pecs, how was response to dry needling?, postural education, scapular retractions, chirp wheel/tennis balls, IYTs, SNAGs, Quadruped- thoracic rotation w/ cervical rotation, thread the needle, cat-cows, prone- super mans, cervical retraction, lat pull downs, banded shoulder er/ir  Beverely Low, Student-PT Peter Congo, PT, DPT, CSRS  02/12/2023, 11:21 AM

## 2023-02-19 ENCOUNTER — Ambulatory Visit: Payer: Medicare Other | Admitting: Physical Therapy

## 2023-02-23 ENCOUNTER — Other Ambulatory Visit: Payer: Self-pay | Admitting: Family Medicine

## 2023-02-23 DIAGNOSIS — Z0000 Encounter for general adult medical examination without abnormal findings: Secondary | ICD-10-CM | POA: Diagnosis not present

## 2023-02-23 DIAGNOSIS — R059 Cough, unspecified: Secondary | ICD-10-CM | POA: Diagnosis not present

## 2023-02-23 DIAGNOSIS — M810 Age-related osteoporosis without current pathological fracture: Secondary | ICD-10-CM | POA: Diagnosis not present

## 2023-02-23 DIAGNOSIS — K219 Gastro-esophageal reflux disease without esophagitis: Secondary | ICD-10-CM | POA: Diagnosis not present

## 2023-02-23 DIAGNOSIS — I10 Essential (primary) hypertension: Secondary | ICD-10-CM | POA: Diagnosis not present

## 2023-02-23 DIAGNOSIS — M5412 Radiculopathy, cervical region: Secondary | ICD-10-CM | POA: Diagnosis not present

## 2023-02-26 ENCOUNTER — Ambulatory Visit: Payer: Medicare Other | Admitting: Physical Therapy

## 2023-02-26 DIAGNOSIS — M62838 Other muscle spasm: Secondary | ICD-10-CM

## 2023-02-26 DIAGNOSIS — M542 Cervicalgia: Secondary | ICD-10-CM | POA: Diagnosis not present

## 2023-02-26 DIAGNOSIS — M5412 Radiculopathy, cervical region: Secondary | ICD-10-CM

## 2023-02-26 DIAGNOSIS — M25511 Pain in right shoulder: Secondary | ICD-10-CM | POA: Diagnosis not present

## 2023-02-26 NOTE — Therapy (Signed)
OUTPATIENT PHYSICAL THERAPY CERVICAL TREATMENT   Patient Name: Dominique Vazquez MRN: [id number] DOB:[birth year], 67 y.o., female Today's Date: 02/26/2023  END OF SESSION:  PT End of Session - 02/26/23 1442     Visit Number 5    Number of Visits 7   with eval   Date for PT Re-Evaluation 03/17/23   to allow for scheduling delays   Authorization Type [employer]    PT Start Time [phone]    PT Stop Time 1530    PT Time Calculation (min) 48 min    Activity Tolerance Patient tolerated treatment well    Behavior During Therapy [facility] for tasks assessed/performed                 Past Medical History:  Diagnosis Date   Chest pain    Closed fracture of distal end of right radius    Estrogen deficiency    Punctate keratitis    Past Surgical History:  Procedure Laterality Date   CESAREAN SECTION     Patient Active Problem List   Diagnosis Date Noted   Neck pain on right side 12/23/2022   Chronic migraine w/o aura w/o status migrainosus, not intractable 12/23/2022   POSTMENOPAUSAL ATROPHIC VAGINITIS 08/02/2007   MIGRAINE HEADACHE 06/16/2007   HAIR LOSS 06/16/2007    PCP: Deatra James, MD   REFERRING PROVIDER: Levert Feinstein, MD  REFERRING DIAG: M54.2 (ICD-10-CM) - Neck pain on right side  THERAPY DIAG:  Right shoulder pain, unspecified chronicity  Cervicalgia  Other muscle spasm  Radiculopathy, cervical region  Rationale for Evaluation and Treatment: Rehabilitation  ONSET DATE: 01/11/2023  SUBJECTIVE:                                                                                                                                                                                                         SUBJECTIVE STATEMENT:  Pt reports feeling well. DN was helpful last week. 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 Yesterday she went with a friend to a [employer], experienced tightness on R, tightness stayed about 30  minutes. "I know it's stress"  Hand dominance: Right  PERTINENT HISTORY:      Migraine, R ulna fx ~2.5 yrs ago not surgically repaired  PAIN: Are you having pain? No   PRECAUTIONS: None  RED FLAGS: Bowel or bladder incontinence: No     WEIGHT BEARING RESTRICTIONS: No  FALLS:  Has patient fallen in last 6 months? 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  DIAGNOSTIC FINDINGS:  EXAM: MRI cervical spine without contrast 01/07/2023  IMPRESSION: MRI scan of cervical spine without contrast showing mild disc degenerative changes from C3-C6 with without significant compression  PATIENT SURVEYS:  NDI 12/50, mild disability  COGNITION: Overall cognitive status: Within functional limits for tasks assessed  SENSATION: Light touch: Impaired Feels less on R side, rates tingling 2/10   POSTURE: rounded shoulders and forward head  CERVICAL ROM:   Active ROM A/PROM (deg) eval  Flexion 18* tingling with initial movement  Extension 45* Pain across base of back of neck  Right lateral flexion 35*  Left lateral flexion 34*  Right rotation 70*  Left rotation 90*   (Blank rows = not tested)  UPPER EXTREMITY ROM:  Active ROM Right eval Left eval  Shoulder flexion Full, pain free Full, pain free  Shoulder extension    Shoulder abduction Full, pain free Full, pain free  Shoulder adduction    Shoulder extension     Shoulder internal rotation    Shoulder external rotation    Elbow flexion [facility] WFL  Elbow extension [facility]  Wrist flexion    Wrist extension    Wrist ulnar deviation    Wrist radial deviation    Wrist pronation    Wrist supination     (Blank rows = not tested)  UPPER EXTREMITY MMT:  MMT Right eval Left eval  Shoulder flexion 5 5  Shoulder extension    Shoulder abduction 5 5  Shoulder adduction    Shoulder extension    Shoulder internal rotation    Shoulder external rotation    Middle trapezius    Lower trapezius    Elbow flexion 5 5  Elbow extension 5 5  Wrist flexion    Wrist extension    Wrist ulnar deviation    Wrist radial deviation    Wrist pronation    Wrist supination    Grip strength WFL WFL   (Blank rows = not tested)  CERVICAL SPECIAL TESTS:  Median Nerve: Negative Ulnar Nerve: Negative, strong stretching sensation Radial Nerve: Negative   TODAY'S TREATMENT:        TherAct Trigger Point Dry-Needling  Treatment instructions: Expect mild to moderate muscle soreness. S/S of pneumothorax if dry needled over a lung field, and to seek immediate medical attention should they occur. Patient verbalized understanding of these instructions and education.  Patient Consent Given: Yes Education handout provided: Yes Muscles treated: R upper trapezius, R sternocleidomastoid Treatment response/outcome: deep ache/pressure; muscle twitch detected; "oh that felt good"  TherEx Quadruped thoracic rotation with reach x10 repetitions, performed bilaterally  Cat-cow x2 minutes  Open book thoracic rotations x2 minutes followed by 30 second over pressure stretch, performed bilaterally  Standing BUE lat pull down w/ blue band resistance x10 repetitions  Standing shoulder IR at 0* abduction w/ blue band resistance x10 repetitions, performed  bilaterally  Standing shoulder ER at 0* abduction w/ blue band resistance x10 repetitions,performed bilaterally     PATIENT EDUCATION:  Education details: HEP additions, continue HEP, POC, cavitations Person educated: Patient Education method: Explanation, Demonstration, Verbal cues, and Handouts Education comprehension: verbalized understanding, returned demonstration, and needs further education  HOME EXERCISE PROGRAM: Access Code: [email or web address] URL: [email or web address] Date: 01/29/2023 Prepared by: Peter Congo  Exercises - Seated Upper Trapezius Stretch  - 1 x daily - 7 x weekly - 1 sets - 3-5 reps - 30-60 sec hold - Gentle Levator Scapulae Stretch  - 1 x daily - 7 x weekly - 1 sets - 3-5 reps - 30-60 sec hold - Sternocleidomastoid Stretch  - 1 x daily - 7 x weekly - 1 sets - 3-5 reps - 30-60 sec hold - Seated Thoracic Flexion and Rotation with Swiss Ball  - 1 x daily - 7 x weekly - 1 sets - 10 reps - 30 sec hold - Seated Flexion Stretch with Swiss Ball  - 1 x daily - 7 x weekly - 1 sets - 10 reps - 30 sec hold - Supine Chin Tuck  - 1 x daily - 7 x weekly - 3 sets - 10 reps - Seated Shoulder Flexion with Dumbbells  - 1 x daily - 7 x weekly - 3 sets - 10 reps - Seated Bilateral Shoulder Scaption with Dumbbells  - 1 x daily - 7 x weekly - 3 sets - 10 reps - Seated Shoulder Abduction - Thumbs Up  - 1 x daily - 7 x weekly - 3 sets - 10 reps - Supine Scapular Protraction in Flexion with Dumbbells  - 1 x daily - 7 x weekly - 3 sets - 10 reps - Supine Shoulder Flexion Extension Full Range AROM  - 1 x daily - 7 x weekly - 1 sets - 3 reps - 30-60 seconds hold - Seated Shoulder Row with Anchored Resistance  - 1 x daily - 7 x weekly - 3 sets - 10 reps - Scapular Wall Slides  - 1 x daily - 7 x weekly - 3 sets - 10 reps - Quadruped Full Range Thoracic Rotation with Reach  - 1 x daily - 7 x weekly - 2 sets - 10 reps - 5 second hold - Cat Cow  - 1 x daily - 7 x weekly - 2 sets -  20-30 reps - Sidelying Thoracic Rotation with Open Book  - 1 x daily - 7 x weekly - 3 sets - 10 reps - Seated High Lat Pull Down with Overhead Anchored Resistance  - 1 x daily - 7 x weekly - 2 sets - 10 reps - Shoulder Internal Rotation with Resistance  - 1 x daily - 7 x weekly - 2 sets - 10 reps - Shoulder External Rotation with Anchored Resistance  - 1 x daily - 7 x weekly - 2 sets - 10 reps  ASSESSMENT:  CLINICAL IMPRESSION:  Emphasis of skilled PT session on performing TPDN to R upper trapezius and sternocleidomastoid followed by stretching and strengthening of shoulder and scapular musculature. Pt with good relief of tightness and pain following TPDN  and stretching. Pt worried about popping noises with strengthening exercises, SPT reassured pt that if the noises are pain-free the pt can continue. Continue POC.    OBJECTIVE IMPAIRMENTS: decreased mobility, decreased ROM, hypomobility, increased muscle spasms, impaired flexibility, impaired sensation, and pain.   ACTIVITY LIMITATIONS: sleeping  PARTICIPATION LIMITATIONS: community activity and occupation  PERSONAL FACTORS: Age, Fitness, and 1 comorbidity: migraine  are also affecting patient's functional outcome.   REHAB POTENTIAL: Good  CLINICAL DECISION MAKING: Stable/uncomplicated  EVALUATION COMPLEXITY: Low   GOALS: Goals reviewed with patient? Yes  SHORT TERM GOALS: Target date: 02/10/2023    Pt will be independent with initial HEP for improved cervical ROM, improved posture and management of pain symptoms in order to build upon functional gains made in therapy. Baseline:  Goal status: MET   LONG TERM GOALS: Target date: 03/03/2023  Pt will be independent with final HEP for improved cervical ROM, improved posture and management of pain symptoms in order to build upon functional gains made in therapy. Baseline:  Goal status: INITIAL  2.  Pt will improve NDI to 7/50 (MCID 5) to demonstrate decreased neck pain and  increased function with daily activities and participation. Baseline: 12/50 (01/20/23) Goal status: INITIAL  3.   Pt will improve neck flexion ROM to at least 40* to demonstrate improved functional mobility.  Baseline: 18* (01/20/23) Goal status: INITIAL  4.  Pt will improve neck extension ROM to at least 50* to demonstrate improved functional mobility. Baseline: 45* (01/20/23) Goal status: INITIAL   PLAN:  PT FREQUENCY: 1x/week  PT DURATION: 6 weeks  PLANNED INTERVENTIONS: Therapeutic exercises, Therapeutic activity, Neuromuscular re-education, Balance training, Gait training, Patient/Family education, Self Care, Joint mobilization, Dry Needling, Electrical stimulation, Spinal mobilization, Cryotherapy, Moist heat, Taping, Traction, Manual therapy, and Re-evaluation  PLAN FOR NEXT SESSION: LTGs, questions about HEP?, DC  Beverely Low, Student-PT Peter Congo, PT, DPT, CSRS  02/26/2023, 3:38 PM

## 2023-03-05 ENCOUNTER — Ambulatory Visit: Payer: Medicare Other | Admitting: Physical Therapy

## 2023-03-10 DIAGNOSIS — H401232 Low-tension glaucoma, bilateral, moderate stage: Secondary | ICD-10-CM | POA: Diagnosis not present

## 2023-03-15 ENCOUNTER — Ambulatory Visit: Payer: Medicare Other | Admitting: Neurology

## 2023-04-30 DIAGNOSIS — K219 Gastro-esophageal reflux disease without esophagitis: Secondary | ICD-10-CM | POA: Diagnosis not present

## 2023-05-10 ENCOUNTER — Other Ambulatory Visit: Payer: Self-pay | Admitting: Family Medicine

## 2023-05-10 DIAGNOSIS — R6889 Other general symptoms and signs: Secondary | ICD-10-CM

## 2023-05-19 ENCOUNTER — Ambulatory Visit
Admission: RE | Admit: 2023-05-19 | Discharge: 2023-05-19 | Disposition: A | Discharge status: Home / Self Care | Payer: Medicare Other | Source: Ambulatory Visit | Attending: Family Medicine | Admitting: Family Medicine

## 2023-05-19 DIAGNOSIS — R0989 Other specified symptoms and signs involving the circulatory and respiratory systems: Secondary | ICD-10-CM | POA: Diagnosis not present

## 2023-05-19 DIAGNOSIS — R6889 Other general symptoms and signs: Secondary | ICD-10-CM

## 2023-05-26 ENCOUNTER — Encounter: Payer: Self-pay | Admitting: Physical Therapy

## 2023-06-23 DIAGNOSIS — R051 Acute cough: Secondary | ICD-10-CM | POA: Diagnosis not present

## 2023-06-24 ENCOUNTER — Encounter (HOSPITAL_COMMUNITY): Payer: Self-pay

## 2023-06-24 ENCOUNTER — Other Ambulatory Visit: Payer: Self-pay

## 2023-06-24 ENCOUNTER — Emergency Department (HOSPITAL_COMMUNITY)
Admission: EM | Admit: 2023-06-24 | Discharge: 2023-06-24 | Disposition: A | Discharge status: Home / Self Care | Attending: Emergency Medicine | Admitting: Emergency Medicine

## 2023-06-24 DIAGNOSIS — R197 Diarrhea, unspecified: Secondary | ICD-10-CM | POA: Diagnosis not present

## 2023-06-24 DIAGNOSIS — R55 Syncope and collapse: Secondary | ICD-10-CM | POA: Diagnosis not present

## 2023-06-24 DIAGNOSIS — W19XXXA Unspecified fall, initial encounter: Secondary | ICD-10-CM | POA: Diagnosis not present

## 2023-06-24 LAB — CBC
HCT: 38.5 % (ref 36.0–46.0)
Hemoglobin: 13.2 g/dL (ref 12.0–15.0)
MCH: 30.6 pg (ref 26.0–34.0)
MCHC: 34.3 g/dL (ref 30.0–36.0)
MCV: 89.1 fL (ref 80.0–100.0)
Platelets: 190 10*3/uL (ref 150–400)
RBC: 4.32 MIL/uL (ref 3.87–5.11)
RDW: 12.3 % (ref 11.5–15.5)
WBC: 8.2 10*3/uL (ref 4.0–10.5)
nRBC: 0 % (ref 0.0–0.2)

## 2023-06-24 LAB — BASIC METABOLIC PANEL
Anion gap: 13 (ref 5–15)
BUN: 11 mg/dL (ref 8–23)
CO2: 20 mmol/L — ABNORMAL LOW (ref 22–32)
Calcium: 8.4 mg/dL — ABNORMAL LOW (ref 8.9–10.3)
Chloride: 108 mmol/L (ref 98–111)
Creatinine, Ser: 0.76 mg/dL (ref 0.44–1.00)
GFR, Estimated: >60 mL/min (ref >60)
Glucose, Bld: 104 mg/dL — ABNORMAL HIGH (ref 70–99)
Potassium: 3.5 mmol/L (ref 3.5–5.1)
Sodium: 141 mmol/L (ref 135–145)

## 2023-06-24 MED ORDER — SODIUM CHLORIDE 0.9 % IV BOLUS
1000.0000 mL | Freq: Once | INTRAVENOUS | Status: AC
  Administered 2023-06-24: 1000 mL via INTRAVENOUS

## 2023-06-24 NOTE — ED Triage Notes (Signed)
 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  Patient states she has had a little bug, with diarrhea that started yesterday.    132/78 HR 90 16 Resp 98% RA CBG 138 18 gauge Left AC

## 2023-06-24 NOTE — ED Provider Notes (Signed)
 [place] EMERGENCY DEPARTMENT AT [facility] Provider Note   CSN: [id number] Arrival date & time: 06/24/23  1124     History  Chief Complaint  Patient presents with   Loss of Consciousness    Dominique Vazquez is a 68 y.o. female.   Loss of Consciousness Patient with syncopal episode.  Was getting her hair done.  Felt as if she had to have diarrhea which she has had for the last day or 2.  States she had diarrhea and then passed out.  Blurred vision.  Was out for about a minute.  No injury.  Feeling better now.  Patient has family hours with who states she is back at baseline.  Has had a previous episode of this years ago.  Also had some palpitations and recent workup thought maybe was due partially to anxiety.     Home Medications Prior to Admission medications   Medication Sig Start Date End Date Taking? Authorizing Provider  amLODipine (NORVASC) 10 MG tablet Take 1 tablet (10 mg total) by mouth daily. 10/07/22 01/14/23  Orbie Pyo, MD  gabapentin (NEURONTIN) 300 MG capsule Take 1 capsule (300 mg total) by mouth 3 (three) times daily. 01/11/23   Levert Feinstein, MD  meloxicam (MOBIC) 7.5 MG tablet Take 1 tablet (7.5 mg total) by mouth daily. 09/13/18   Cathie Hoops, Amy V, PA-C  metoprolol tartrate (LOPRESSOR) 100 MG tablet Take 1 tablet (100 mg total) by mouth once for 1 dose. Take 90-120 minutes prior to scan. 10/07/22 11/23/23  Orbie Pyo, MD  nortriptyline (PAMELOR) 25 MG capsule Take 1 capsule (25 mg total) by mouth at bedtime. 01/18/23   Levert Feinstein, MD  omeprazole (PRILOSEC) 20 MG capsule Take 1 capsule (20 mg total) by mouth daily. 09/16/22   Sabas Sous, MD  sucralfate (CARAFATE) 1 g tablet Take 1 tablet (1 g total) by mouth 4 (four) times daily as needed. 09/16/22   Sabas Sous, MD      Allergies    Penicillins    Review of Systems   Review of Systems  Cardiovascular:  Positive for syncope.    Physical Exam Updated Vital Signs BP (!) 142/74 (BP Location:  Right Arm)   Pulse 90   Temp 98.5 F (36.9 C) (Oral)   Resp 16   SpO2 100%  Physical Exam Vitals and nursing note reviewed.  Cardiovascular:     Rate and Rhythm: Regular rhythm.  Pulmonary:     Breath sounds: No wheezing.  Abdominal:     Tenderness: There is no abdominal tenderness.  Musculoskeletal:        General: No tenderness.     Cervical back: Neck supple.  Neurological:     Mental Status: She is alert. Mental status is at baseline.     ED Results / Procedures / Treatments   Labs (all labs ordered are listed, but only abnormal results are displayed) Labs Reviewed  BASIC METABOLIC PANEL - Abnormal; Notable for the following components:      Result Value   CO2 20 (*)    Glucose, Bld 104 (*)    Calcium 8.4 (*)    All other components within normal limits  CBC    EKG EKG Interpretation Date/Time:  Thursday June 24 2023 13:28:15 EDT Ventricular Rate:  90 PR Interval:  150 QRS Duration:  93 QT Interval:  368 QTC Calculation: 451 R Axis:   82  Text Interpretation: Sinus rhythm Probable left atrial enlargement Borderline  right axis deviation No significant change since last tracing Confirmed by Benjiman Core [phone]) on 06/24/2023 1:31:38 PM  Radiology No results found.  Procedures Procedures    Medications Ordered in ED Medications  sodium chloride 0.9 % bolus 1,000 mL (1,000 mLs Intravenous New Bag/Given 06/24/23 1216)    ED Course/ Medical Decision Making/ A&P                                 Medical Decision Making Amount and/or Complexity of Data Reviewed Labs: ordered.   Patient syncopal episode after diarrhea episode.  Differential diagnosis long but does include cause such arrhythmia and vasovagal syncope.  Will get basic blood work.  Will get EKG and give a fluid bolus.  Had reassuring echocardiogram and cardiac echo about 8 months ago.  Blood work reassuring.  Feels better after fluid bolus.  I think stable for discharge home.  I think  most likely vasovagal event.  Follow-up with PCP as needed.        Final Clinical Impression(s) / ED Diagnoses Final diagnoses:  Vasovagal syncope    Rx / DC Orders ED Discharge Orders     None         Benjiman Core, MD 06/24/23 1335

## 2023-06-24 NOTE — ED Notes (Signed)
Walked patient to the bathroom patient did well 

## 2023-06-25 ENCOUNTER — Other Ambulatory Visit: Payer: Self-pay | Admitting: Family Medicine

## 2023-06-25 DIAGNOSIS — Z1231 Encounter for screening mammogram for malignant neoplasm of breast: Secondary | ICD-10-CM

## 2023-07-19 ENCOUNTER — Telehealth: Payer: Self-pay | Admitting: Neurology

## 2023-07-19 NOTE — Telephone Encounter (Signed)
 Pt reschedule due to scheduling conflict

## 2023-07-23 DIAGNOSIS — Z1231 Encounter for screening mammogram for malignant neoplasm of breast: Secondary | ICD-10-CM

## 2023-07-26 ENCOUNTER — Ambulatory Visit: Payer: Medicare Other | Admitting: Family Medicine

## 2023-07-29 ENCOUNTER — Inpatient Hospital Stay: Admission: RE | Admit: 2023-07-29 | Source: Ambulatory Visit

## 2023-08-04 ENCOUNTER — Other Ambulatory Visit: Payer: Self-pay | Admitting: Family Medicine

## 2023-08-04 DIAGNOSIS — Z1231 Encounter for screening mammogram for malignant neoplasm of breast: Secondary | ICD-10-CM

## 2023-08-06 ENCOUNTER — Ambulatory Visit
Admission: RE | Admit: 2023-08-06 | Discharge: 2023-08-06 | Disposition: A | Discharge status: Home / Self Care | Source: Ambulatory Visit

## 2023-08-06 DIAGNOSIS — Z1231 Encounter for screening mammogram for malignant neoplasm of breast: Secondary | ICD-10-CM | POA: Diagnosis not present

## 2023-08-31 DIAGNOSIS — I10 Essential (primary) hypertension: Secondary | ICD-10-CM | POA: Diagnosis not present

## 2023-08-31 DIAGNOSIS — M5412 Radiculopathy, cervical region: Secondary | ICD-10-CM | POA: Diagnosis not present

## 2023-09-07 DIAGNOSIS — H11823 Conjunctivochalasis, bilateral: Secondary | ICD-10-CM | POA: Diagnosis not present

## 2023-09-07 DIAGNOSIS — H04123 Dry eye syndrome of bilateral lacrimal glands: Secondary | ICD-10-CM | POA: Diagnosis not present

## 2023-09-07 DIAGNOSIS — H25812 Combined forms of age-related cataract, left eye: Secondary | ICD-10-CM | POA: Diagnosis not present

## 2023-09-07 DIAGNOSIS — H401232 Low-tension glaucoma, bilateral, moderate stage: Secondary | ICD-10-CM | POA: Diagnosis not present

## 2023-10-21 ENCOUNTER — Encounter (HOSPITAL_BASED_OUTPATIENT_CLINIC_OR_DEPARTMENT_OTHER): Payer: Self-pay

## 2023-10-21 ENCOUNTER — Other Ambulatory Visit: Payer: Medicare Other

## 2023-10-21 ENCOUNTER — Other Ambulatory Visit (HOSPITAL_BASED_OUTPATIENT_CLINIC_OR_DEPARTMENT_OTHER)

## 2023-11-11 DIAGNOSIS — M810 Age-related osteoporosis without current pathological fracture: Secondary | ICD-10-CM | POA: Diagnosis not present

## 2023-11-11 DIAGNOSIS — I10 Essential (primary) hypertension: Secondary | ICD-10-CM | POA: Diagnosis not present

## 2023-11-25 DIAGNOSIS — K146 Glossodynia: Secondary | ICD-10-CM | POA: Diagnosis not present

## 2023-11-25 DIAGNOSIS — R220 Localized swelling, mass and lump, head: Secondary | ICD-10-CM | POA: Diagnosis not present

## 2023-12-12 DIAGNOSIS — M810 Age-related osteoporosis without current pathological fracture: Secondary | ICD-10-CM | POA: Diagnosis not present

## 2023-12-12 DIAGNOSIS — I10 Essential (primary) hypertension: Secondary | ICD-10-CM | POA: Diagnosis not present

## 2023-12-18 ENCOUNTER — Encounter (INDEPENDENT_AMBULATORY_CARE_PROVIDER_SITE_OTHER): Payer: Self-pay

## 2024-01-06 ENCOUNTER — Other Ambulatory Visit (HOSPITAL_COMMUNITY): Payer: Self-pay | Admitting: Family Medicine

## 2024-01-06 ENCOUNTER — Ambulatory Visit (HOSPITAL_COMMUNITY)
Admission: RE | Admit: 2024-01-06 | Discharge: 2024-01-06 | Disposition: A | Discharge status: Home / Self Care | Source: Ambulatory Visit | Attending: Family Medicine | Admitting: Family Medicine

## 2024-01-06 DIAGNOSIS — R200 Anesthesia of skin: Secondary | ICD-10-CM | POA: Insufficient documentation

## 2024-01-06 DIAGNOSIS — E785 Hyperlipidemia, unspecified: Secondary | ICD-10-CM | POA: Diagnosis not present

## 2024-01-06 DIAGNOSIS — I10 Essential (primary) hypertension: Secondary | ICD-10-CM | POA: Diagnosis not present

## 2024-01-11 DIAGNOSIS — M810 Age-related osteoporosis without current pathological fracture: Secondary | ICD-10-CM | POA: Diagnosis not present

## 2024-01-11 DIAGNOSIS — I10 Essential (primary) hypertension: Secondary | ICD-10-CM | POA: Diagnosis not present

## 2024-01-12 ENCOUNTER — Ambulatory Visit (HOSPITAL_BASED_OUTPATIENT_CLINIC_OR_DEPARTMENT_OTHER)
Admission: RE | Admit: 2024-01-12 | Discharge: 2024-01-12 | Disposition: A | Discharge status: Home / Self Care | Source: Ambulatory Visit | Attending: Family Medicine | Admitting: Family Medicine

## 2024-01-12 DIAGNOSIS — M810 Age-related osteoporosis without current pathological fracture: Secondary | ICD-10-CM | POA: Insufficient documentation

## 2024-01-12 DIAGNOSIS — M8589 Other specified disorders of bone density and structure, multiple sites: Secondary | ICD-10-CM | POA: Diagnosis not present

## 2024-01-13 DIAGNOSIS — H04123 Dry eye syndrome of bilateral lacrimal glands: Secondary | ICD-10-CM | POA: Diagnosis not present

## 2024-01-13 DIAGNOSIS — H401232 Low-tension glaucoma, bilateral, moderate stage: Secondary | ICD-10-CM | POA: Diagnosis not present

## 2024-02-02 NOTE — Progress Notes (Signed)
 Chief Complaint  Patient presents with   Follow-up    Pt in room 1. Alone.Paper referral for Right sided numbness.    HISTORY OF PRESENT ILLNESS:  02/07/24 ALL:  Dominique Vazquez is a 68 y.o. female here today for follow up for neck pain. She was seen in consult with Dr Onita 12/2022, MRI showed mild degenerative changes but no significant compression. She was referred to PT and gabapentin  increased to 300mg  TID. She called 01/2023 reporting more headaches and continued neck pain. Nortriptyline  25mg  at bedtime started and gabapentin  increased to 600mg  TID. 6 month follow up with NP advised.   Since, she reports symptoms have continued but seem to be a little better. She continues to have intermittent right arm and ear pain. Feels face is drawing. Occasional pain in right breast. She feels gabapentin  and nortriptyline  have helped but make her feel weird. She has taken both as needed and feels they work better this way. She feels pain is under better control. She endorses stress at home. She has a granddaughter with down's syndrome.    HISTORY (copied from Dr Georgianne previous note)  Dominique Vazquez, is a 68 year old female seen in request by her primary care from [facility] Dr. Austin, Vyvyan, for evaluation of neck pain radiating pain to upper extremity, accompanied by her daughter at today's visit December 23, 2022   I reviewed and summarized the referring note.PMHX HTN GERD Right wrist fracture   She had long history of chronic mild brain, typical migraine is right retro-orbital area pounding headache with light, noise sensitivity, nauseous, lasting for few hours, resolved by Maxalt, she has not had typical migraine for years   In July 2024, she fell to 1 sharp pain at the right scalp region, only last for few seconds, since then, she has frequent right neck pain, radiating pain to right upper scapular region, sometimes to right upper chest, right arm, involving whole right hand, also spreading  upwards to right occipital region, she denies weakness, no gait abnormality   But complains of difficulty sleeping because neck discomfort,   Laboratory evaluation in October 2023, normal CMP creatinine 0.85   REVIEW OF SYSTEMS: Out of a complete 14 system review of symptoms, the patient complains only of the following symptoms, neck, arm and face pain and all other reviewed systems are negative.   ALLERGIES: Allergies  Allergen Reactions   Penicillins     REACTION: swelling     HOME MEDICATIONS: Outpatient Medications Prior to Visit  Medication Sig Dispense Refill   amLODipine  (NORVASC ) 10 MG tablet Take 1 tablet (10 mg total) by mouth daily. 180 tablet 3   meloxicam  (MOBIC ) 7.5 MG tablet Take 1 tablet (7.5 mg total) by mouth daily. 15 tablet 0   metoprolol  tartrate (LOPRESSOR ) 100 MG tablet Take 1 tablet (100 mg total) by mouth once for 1 dose. Take 90-120 minutes prior to scan. 1 tablet 0   nortriptyline  (PAMELOR ) 25 MG capsule Take 1 capsule (25 mg total) by mouth at bedtime. 60 capsule 5   omeprazole  (PRILOSEC) 20 MG capsule Take 1 capsule (20 mg total) by mouth daily. 30 capsule 1   sucralfate  (CARAFATE ) 1 g tablet Take 1 tablet (1 g total) by mouth 4 (four) times daily as needed. 30 tablet 0   gabapentin  (NEURONTIN ) 300 MG capsule Take 1 capsule (300 mg total) by mouth 3 (three) times daily. (Patient not taking: Reported on 02/07/2024) 90 capsule 5   No facility-administered medications prior  to visit.     PAST MEDICAL HISTORY: Past Medical History:  Diagnosis Date   Chest pain    Closed fracture of distal end of right radius    Estrogen deficiency    Punctate keratitis      PAST SURGICAL HISTORY: Past Surgical History:  Procedure Laterality Date   CESAREAN SECTION       FAMILY HISTORY: Family History  Problem Relation Age of Onset   Breast cancer Neg Hx      SOCIAL HISTORY: Social History   Socioeconomic History   Marital status: Married    Spouse  name: Not on file   Number of children: Not on file   Years of education: Not on file   Highest education level: Not on file  Occupational History   Not on file  Tobacco Use   Smoking status: Never   Smokeless tobacco: Never  Vaping Use   Vaping status: Never Used  Substance and Sexual Activity   Alcohol use: Not Currently   Drug use: Not Currently   Sexual activity: Not on file  Other Topics Concern   Not on file  Social History Narrative   Not on file   Social Drivers of Health   Financial Resource Strain: Not on file  Food Insecurity: Not on file  Transportation Needs: Not on file  Physical Activity: Not on file  Stress: Not on file  Social Connections: Unknown (08/25/2021)   Received from [facility]   Social Network    Social Network: Not on file  Intimate Partner Violence: Unknown (07/17/2021)   Received from [facility]   HITS    Physically Hurt: Not on file    Insult or Talk Down To: Not on file    Threaten Physical Harm: Not on file    Scream or Curse: Not on file     PHYSICAL EXAM  Vitals:   02/07/24 1313  BP: 122/84  Pulse: 80  Weight: 140 lb (63.5 kg)  Height: 5' 4 (1.626 m)   Body mass index is 24.03 kg/m.  Generalized: Well developed, in no acute distress  Cardiology: normal rate and rhythm, no murmur auscultated  Respiratory: clear to auscultation bilaterally    Neurological examination  Mentation: Alert oriented to time, place, history taking. Follows all commands speech and language fluent Cranial nerve II-XII: Pupils were equal round reactive to light. Extraocular movements were full, visual field were full on confrontational test. Facial sensation and strength were normal. Uvula tongue midline. Head turning and shoulder shrug  were normal and symmetric. Motor: The motor testing reveals 5 over 5 strength of all 4 extremities. Good symmetric motor tone is noted throughout.  Gait and station: Gait is normal.    DIAGNOSTIC DATA (LABS,  IMAGING, TESTING) - I reviewed patient records, labs, notes, testing and imaging myself where available.  Lab Results  Component Value Date   WBC 8.2 06/24/2023   HGB 13.2 06/24/2023   HCT 38.5 06/24/2023   MCV 89.1 06/24/2023   PLT 190 06/24/2023      Component Value Date/Time   NA 141 06/24/2023 1145   K 3.5 06/24/2023 1145   CL 108 06/24/2023 1145   CO2 20 (L) 06/24/2023 1145   GLUCOSE 104 (H) 06/24/2023 1145   BUN 11 06/24/2023 1145   CREATININE 0.76 06/24/2023 1145   CALCIUM 8.4 (L) 06/24/2023 1145   PROT 7.4 09/16/2022 1846   ALBUMIN 4.8 09/16/2022 1846   AST 20 09/16/2022 1846   ALT 14  09/16/2022 1846   ALKPHOS 44 09/16/2022 1846   BILITOT 0.9 09/16/2022 1846   GFRNONAA >60 06/24/2023 1145   GFRAA 97 07/26/2007 1131   Lab Results  Component Value Date   CHOL 190 07/26/2007   HDL 79.5 07/26/2007   LDLCALC 100 (H) 07/26/2007   TRIG 52 07/26/2007   CHOLHDL 2.4 CALC 07/26/2007   No results found for: HGBA1C No results found for: VITAMINB12 Lab Results  Component Value Date   TSH 1.19 07/26/2007        No data to display               No data to display           ASSESSMENT AND PLAN  68 y.o. year old female  has a past medical history of Chest pain, Closed fracture of distal end of right radius, Estrogen deficiency, and Punctate keratitis. here with    Neck pain on right side  Chronic migraine w/o aura w/o status migrainosus, not intractable  Cervicalgia  Dysesthesia  Dominique Vazquez reports pain has improved. We reviewed MRI brian and cervical spine. She will continue nortriptyline  25mg  at bedtime. May consider taking 1/2 dose if it makes her sleepy. Discussed better efficacy if taken steady dose daily. Continue gabapentin  as needed. She will call when refills are needed. Healthy lifestyle habits encouraged. She will follow up with PCP as directed. She will return to see me in 1 year, sooner if needed. She verbalizes understanding and  agreement with this plan.   No orders of the defined types were placed in this encounter.    No orders of the defined types were placed in this encounter.    Greig Forbes, MSN, FNP-C 02/07/2024, 3:03 PM  [facility] [address] [place], [place] [address] [phone]

## 2024-02-02 NOTE — Patient Instructions (Signed)
 Below is our plan:  We will continue nortriptyline . Take 1/2 tablet (12.5mg ) daily at bedtime. You can continue gabapentin  as needed up to 300mg  twice daily.   Please make sure you are staying well hydrated. I recommend 50-60 ounces daily. Well balanced diet and regular exercise encouraged. Consistent sleep schedule with 6-8 hours recommended.   Please continue follow up with care team as directed.   Follow up with me in 1 year   You may receive a survey regarding today's visit. I encourage you to leave honest feed back as I do use this information to improve patient care. Thank you for seeing me today!   GENERAL HEADACHE INFORMATION:   Natural supplements: Magnesium Oxide or Magnesium Glycinate 500 mg at bed (up to 800 mg daily) Coenzyme Q10 300 mg in AM Vitamin B2- 200 mg twice a day   Add 1 supplement at a time since even natural supplements can have undesirable side effects. You can sometimes buy supplements cheaper (especially Coenzyme Q10) at www.webmailguide.co.za or at [facility].  Migraine with aura: There is increased risk for stroke in women with migraine with aura and a contraindication for the combined contraceptive pill for use by women who have migraine with aura. The risk for women with migraine without aura is lower. However other risk factors like smoking are far more likely to increase stroke risk than migraine. There is a recommendation for no smoking and for the use of OCPs without estrogen such as progestogen only pills particularly for women with migraine with aura.SABRA People who have migraine headaches with auras may be 3 times more likely to have a stroke caused by a blood clot, compared to migraine patients who don't see auras. Women who take hormone-replacement therapy may be 30 percent more likely to suffer a clot-based stroke than women not taking medication containing estrogen. Other risk factors like smoking and high blood pressure may be  much more important.    Vitamins and  herbs that show potential:   Magnesium: Magnesium (250 mg twice a day or 500 mg at bed) has a relaxant effect on smooth muscles such as blood vessels. Individuals suffering from frequent or daily headache usually have low magnesium levels which can be increase with daily supplementation of 400-750 mg. Three trials found 40-90% average headache reduction  when used as a preventative. Magnesium may help with headaches are aura, the best evidence for magnesium is for migraine with aura is its thought to stop the cortical spreading depression we believe is the pathophysiology of migraine aura.Magnesium also demonstrated the benefit in menstrually related migraine.  Magnesium is part of the messenger system in the serotonin cascade and it is a good muscle relaxant.  It is also useful for constipation which can be a side effect of other medications used to treat migraine. Good sources include nuts, whole grains, and tomatoes. Side Effects: loose stool/diarrhea  Riboflavin (vitamin B 2) 200 mg twice a day. This vitamin assists nerve cells in the production of ATP a principal energy storing molecule.  It is necessary for many chemical reactions in the body.  There have been at least 3 clinical trials of riboflavin using 400 mg per day all of which suggested that migraine frequency can be decreased.  All 3 trials showed significant improvement in over half of migraine sufferers.  The supplement is found in bread, cereal, milk, meat, and poultry.  Most Americans get more riboflavin than the recommended daily allowance, however riboflavin deficiency is not necessary for the  supplements to help prevent headache. Side effects: energizing, green urine   Coenzyme Q10: This is present in almost all cells in the body and is critical component for the conversion of energy.  Recent studies have shown that a nutritional supplement of CoQ10 can reduce the frequency of migraine attacks by improving the energy production of cells as  with riboflavin.  Doses of 150 mg twice a day have been shown to be effective.   Melatonin: Increasing evidence shows correlation between melatonin secretion and headache conditions.  Melatonin supplementation has decreased headache intensity and duration.  It is widely used as a sleep aid.  Sleep is natures way of dealing with migraine.  A dose of 3 mg is recommended to start for headaches including cluster headache. Higher doses up to 15 mg has been reviewed for use in Cluster headache and have been used. The rationale behind using melatonin for cluster is that many theories regarding the cause of Cluster headache center around the disruption of the normal circadian rhythm in the brain.  This helps restore the normal circadian rhythm.   HEADACHE DIET: Foods and beverages which may trigger migraine Note that only 20% of headache patients are food sensitive. You will know if you are food sensitive if you get a headache consistently 20 minutes to 2 hours after eating a certain food. Only cut out a food if it causes headaches, otherwise you might remove foods you enjoy! What matters most for diet is to eat a well balanced healthy diet full of vegetables and low fat protein, and to not miss meals.   Chocolate, other sweets ALL cheeses except cottage and cream cheese Dairy products, yogurt, sour cream, ice cream Liver Meat extracts (Bovril, Marmite, meat tenderizers) Meats or fish which have undergone aging, fermenting, pickling or smoking. These include: Hotdogs,salami,Lox,sausage, mortadellas,smoked salmon, pepperoni, Pickled herring Pods of broad bean (English beans, Chinese pea pods, Italian (fava) beans, lima and navy beans Ripe avocado, ripe banana Yeast extracts or active yeast preparations such as Brewer's or Fleishman's (commercial bakes goods are permitted) Tomato based foods, pizza (lasagna, etc.)   MSG (monosodium glutamate) is disguised as many things; look for these common  aliases: Monopotassium glutamate Autolysed yeast Hydrolysed protein Sodium caseinate "flavorings" "all natural preservatives Nutrasweet   Avoid all other foods that convincingly provoke headaches.   Resources: The Dizzy Bluford Aid Your Headache Diet, migrainestrong.com  https://zamora-andrews.com/   Caffeine and Migraine For patients that have migraine, caffeine intake more than 3 days per week can lead to dependency and increased migraine frequency. I would recommend cutting back on your caffeine intake as best you can. The recommended amount of caffeine is 200-300 mg daily, although migraine patients may experience dependency at even lower doses. While you may notice an increase in headache temporarily, cutting back will be helpful for headaches in the long run. For more information on caffeine and migraine, visit: https://americanmigrainefoundation.org/resource-library/caffeine-and-migraine/   Headache Prevention Strategies:   1. Maintain a headache diary; learn to identify and avoid triggers.  - This can be a simple note where you log when you had a headache, associated symptoms, and medications used - There are several smartphone apps developed to help track migraines: Migraine Buddy, Migraine Monitor, Curelator N1-Headache App   Common triggers include: Emotional triggers: Emotional/Upset family or friends Emotional/Upset occupation Business reversal/success Anticipation anxiety Crisis-serious Post-crisis periodNew job/position   Physical triggers: Vacation Day Weekend Strenuous Exercise High Altitude Location New Move Menstrual Day Physical Illness Oversleep/Not enough sleep Weather changes Light:  Photophobia or light sesnitivity treatment involves a balance between desensitization and reduction in overly strong input. Use dark polarized glasses outside, but not inside. Avoid bright or fluorescent light, but do not dim  environment to the point that going into a normally lit room hurts. Consider FL-41 tint lenses, which reduce the most irritating wavelengths without blocking too much light.  These can be obtained at axonoptics.com or theraspecs.com Foods: see list above.   2. Limit use of acute treatments (over-the-counter medications, triptans, etc.) to no more than 2 days per week or 10 days per month to prevent medication overuse headache (rebound headache).     3. Follow a regular schedule (including weekends and holidays): Don't skip meals. Eat a balanced diet. 8 hours of sleep nightly. Minimize stress. Exercise 30 minutes per day. Being overweight is associated with a 5 times increased risk of chronic migraine. Keep well hydrated and drink 6-8 glasses of water per day.   4. Initiate non-pharmacologic measures at the earliest onset of your headache. Rest and quiet environment. Relax and reduce stress. Breathe2Relax is a free app that can instruct you on    some simple relaxtion and breathing techniques. Http://Dawnbuse.com is a    free website that provides teaching videos on relaxation.  Also, there are  many apps that   can be downloaded for "mindful" relaxation.  An app called YOGA NIDRA will help walk you through mindfulness. Another app called Calm can be downloaded to give you a structured mindfulness guide with daily reminders and skill development. Headspace for guided meditation Mindfulness Based Stress Reduction Online Course: www.palousemindfulness.com Cold compresses.   5. Don't wait!! Take the maximum allowable dosage of prescribed medication at the first sign of migraine.   6. Compliance:  Take prescribed medication regularly as directed and at the first sign of a migraine.   7. Communicate:  Call your physician when problems arise, especially if your headaches change, increase in frequency/severity, or become associated with neurological symptoms (weakness, numbness, slurred speech, etc.).  Proceed to emergency room if you experience new or worsening symptoms or symptoms do not resolve, if you have new neurologic symptoms or if headache is severe, or for any concerning symptom.   8. Headache/pain management therapies: Consider various complementary methods, including medication, behavioral therapy, psychological counselling, biofeedback, massage therapy, acupuncture, dry needling, and other modalities.  Such measures may reduce the need for medications. Counseling for pain management, where patients learn to function and ignore/minimize their pain, seems to work very well.   9. Recommend changing family's attention and focus away from patient's headaches. Instead, emphasize daily activities. If first question of day is 'How are your headaches/Do you have a headache today?', then patient will constantly think about headaches, thus making them worse. Goal is to re-direct attention away from headaches, toward daily activities and other distractions.   10. Helpful Websites: www.AmericanHeadacheSociety.org patenthood.ch www.headaches.org tightmarket.nl www.achenet.org

## 2024-02-07 ENCOUNTER — Encounter: Payer: Self-pay | Admitting: Family Medicine

## 2024-02-07 ENCOUNTER — Ambulatory Visit: Admitting: Family Medicine

## 2024-02-07 VITALS — BP 122/84 | HR 80 | Ht 64.0 in | Wt 140.0 lb

## 2024-02-07 DIAGNOSIS — G43709 Chronic migraine without aura, not intractable, without status migrainosus: Secondary | ICD-10-CM | POA: Diagnosis not present

## 2024-02-07 DIAGNOSIS — R208 Other disturbances of skin sensation: Secondary | ICD-10-CM | POA: Diagnosis not present

## 2024-02-07 DIAGNOSIS — M542 Cervicalgia: Secondary | ICD-10-CM | POA: Diagnosis not present

## 2024-02-29 ENCOUNTER — Other Ambulatory Visit: Payer: Self-pay | Admitting: Neurology

## 2024-02-29 NOTE — Telephone Encounter (Signed)
 Pt called to request medication refill nortriptyline  (PAMELOR ) 25 MG capsule   Pt would like medication sent to     [id number] - [place] ([place]), [place] - [address] (Ph: [phone])

## 2024-02-29 NOTE — Telephone Encounter (Signed)
 Amy last note stated: We will continue nortriptyline . Take 1/2 tablet (12.5mg ) daily at bedtime.   It will not allow me to send as 12.5mg 

## 2025-03-05 ENCOUNTER — Ambulatory Visit: Admitting: Family Medicine
